# Patient Record
Sex: Male | Born: 1975 | Race: White | Hispanic: No | Marital: Married | Smoking: Never smoker
Health system: Southern US, Community
[De-identification: ages and names within clinical notes are randomized; demographics above are authoritative.]

## PROBLEM LIST (undated history)

## (undated) DIAGNOSIS — T7840XA Allergy, unspecified, initial encounter: Secondary | ICD-10-CM

## (undated) DIAGNOSIS — E785 Hyperlipidemia, unspecified: Secondary | ICD-10-CM

## (undated) DIAGNOSIS — F419 Anxiety disorder, unspecified: Secondary | ICD-10-CM

## (undated) DIAGNOSIS — I10 Essential (primary) hypertension: Secondary | ICD-10-CM

## (undated) HISTORY — DX: Allergy, unspecified, initial encounter: T78.40XA

---

## 2004-08-08 ENCOUNTER — Ambulatory Visit: Payer: Self-pay | Admitting: Unknown Physician Specialty

## 2012-04-03 ENCOUNTER — Ambulatory Visit (INDEPENDENT_AMBULATORY_CARE_PROVIDER_SITE_OTHER): Payer: Managed Care, Other (non HMO) | Admitting: Family Medicine

## 2012-04-03 VITALS — BP 125/84 | HR 69 | Temp 98.1°F | Resp 16 | Ht 71.0 in | Wt 204.0 lb

## 2012-04-03 DIAGNOSIS — Z0000 Encounter for general adult medical examination without abnormal findings: Secondary | ICD-10-CM

## 2012-04-03 DIAGNOSIS — R7301 Impaired fasting glucose: Secondary | ICD-10-CM

## 2012-04-03 DIAGNOSIS — E785 Hyperlipidemia, unspecified: Secondary | ICD-10-CM

## 2012-04-03 LAB — LIPID PANEL: Cholesterol: 246 mg/dL — ABNORMAL HIGH (ref 0–200)

## 2012-04-03 LAB — TSH: TSH: 3.331 u[IU]/mL (ref 0.350–4.500)

## 2012-04-03 LAB — COMPREHENSIVE METABOLIC PANEL
ALT: 24 U/L (ref 0–53)
Alkaline Phosphatase: 43 U/L (ref 39–117)
CO2: 27 mEq/L (ref 19–32)
Creat: 0.81 mg/dL (ref 0.50–1.35)
Sodium: 137 mEq/L (ref 135–145)
Total Bilirubin: 0.6 mg/dL (ref 0.3–1.2)
Total Protein: 7.3 g/dL (ref 6.0–8.3)

## 2012-04-03 LAB — POCT CBC
Hemoglobin: 15 g/dL (ref 14.1–18.1)
Lymph, poc: 2.5 (ref 0.6–3.4)
MCH, POC: 29 pg (ref 27–31.2)
MCHC: 32.3 g/dL (ref 31.8–35.4)
MID (cbc): 0.5 (ref 0–0.9)
MPV: 9.1 fL (ref 0–99.8)
POC Granulocyte: 2.3 (ref 2–6.9)
POC MID %: 9.4 %M (ref 0–12)
Platelet Count, POC: 213 10*3/uL (ref 142–424)
WBC: 5.3 10*3/uL (ref 4.6–10.2)

## 2012-04-03 NOTE — Progress Notes (Signed)
Subjective:    Patient ID: Arthur Stevens, male    DOB: 1976/01/21, 36 y.o.   MRN: [id number]  HPI Arthur Stevens is a 36 y.o. male  Here for physical. Needs form completed. Last physical weight 211 in 2011. Usually under 200.  No regular exercise. Has 2 and 1/38 yo dtr at home.  2nd child expected in February, work is going well.  Fasting this morning.   Hyperlipidemia - last lipids TC 257, HDL 58, LDL 178 on 03/04/10.  Plan on recheck in 6 weeks. No known FH of MI/CAD. Non smoker.  Glucose 107 on 03/04/10 fasting physical.  Review of Systems  Skin:       Callous on L foot for about a month  All other systems reviewed and are negative.       Objective:   Physical Exam  Vitals reviewed. Constitutional: He is oriented to person, place, and time. He appears well-developed and well-nourished.  HENT:  Head: Normocephalic and atraumatic.  Right Ear: External ear normal.  Left Ear: External ear normal.  Mouth/Throat: Oropharynx is clear and moist.  Eyes: Conjunctivae normal and EOM are normal. Pupils are equal, round, and reactive to light.  Neck: Normal range of motion. Neck supple. No thyromegaly present.  Cardiovascular: Normal rate, regular rhythm, normal heart sounds and intact distal pulses.   Pulmonary/Chest: Effort normal and breath sounds normal. No respiratory distress. He has no wheezes.  Abdominal: Soft. He exhibits no distension. There is no tenderness. Hernia confirmed negative in the right inguinal area and confirmed negative in the left inguinal area.  Genitourinary:    Right testis shows no mass and no tenderness. Left testis shows no mass and no tenderness.  Musculoskeletal: Normal range of motion. He exhibits no edema and no tenderness.  Lymphadenopathy:    He has no cervical adenopathy.  Neurological: He is alert and oriented to person, place, and time. He has normal reflexes.  Skin: Skin is warm and dry.       L foot - lateral aspect, approx 1 cm callous with  few elevated ridges. No surrounding erythema.  Psychiatric: He has a normal mood and affect. His behavior is normal.       Assessment & Plan:  [name] is a 36 y.o. male . 1. Elevated fasting glucose  Comprehensive metabolic panel  2. Hyperlipidemia  Comprehensive metabolic panel, POCT CBC, Lipid panel  3. Routine general medical examination at a health care facility  Comprehensive metabolic panel, POCT CBC, Lipid panel, TSH    CPE - labs above. Declined sti testing. Anticipatory guidance. Had flu shot already this year.   Hyperlipidemia - recheck today. Consider meds if elevated as in past.  Work on diet and wt loss.   Prior elevated glucose - recheck today as fasting.   L foot callous vs plantars wart.  Trial of otc compound W, callous pad and recheck in next month for paring/tx if needed.   Patient Instructions  Your should receive a call or letter about your lab results within the next week to 10 days.   Keeping you healthy  Get these tests  Blood pressure- Have your blood pressure checked once a year by your healthcare provider.  Normal blood pressure is 120/80.  Weight- Have your body mass index (BMI) calculated to screen for obesity.  BMI is a measure of body fat based on height and weight. You can also calculate your own BMI at https://www.west-esparza.com/.  Cholesterol- Have your cholesterol checked regularly starting  at age 81, sooner may be necessary if you have diabetes, high blood pressure, if a family member developed heart diseases at an early age or if you smoke.   Chlamydia, HIV, and other sexual transmitted disease- Get screened each year until the age of 54 then within three months of each new sexual partner.  Diabetes- Have your blood sugar checked regularly if you have high blood pressure, high cholesterol, a family history of diabetes or if you are overweight.  Get these vaccines  Flu shot- Every fall.  Tetanus shot- Every 10 years.  Menactra- Single  dose; prevents meningitis.  Take these steps  Don't smoke- If you do smoke, ask your healthcare provider about quitting. For tips on how to quit, go to www.smokefree.gov or call 1-800-QUIT-NOW.  Be physically active- Exercise 5 days a week for at least 30 minutes.  If you are not already physically active start slow and gradually work up to 30 minutes of moderate physical activity.  Examples of moderate activity include walking briskly, mowing the yard, dancing, swimming bicycling, etc.  Eat a healthy diet- Eat a variety of healthy foods such as fruits, vegetables, low fat milk, low fat cheese, yogurt, lean meats, poultry, fish, beans, tofu, etc.  For more information on healthy eating, go to www.thenutritionsource.org  Drink alcohol in moderation- Limit alcohol intake two drinks or less a day.  Never drink and drive.  Dentist- Brush and floss teeth twice daily; visit your dentis twice a year.  Depression-Your emotional health is as important as your physical health.  If you're feeling down, losing interest in things you normally enjoy please talk with your healthcare provider.  Gun Safety- If you keep a gun in your home, keep it unloaded and with the safety lock on.  Bullets should be stored separately.  Helmet use- Always wear a helmet when riding a motorcycle, bicycle, rollerblading or skateboarding.  Safe sex- If you may be exposed to a sexually transmitted infection, use a condom  Seat belts- Seat bels can save your life; always wear one.  Smoke/Carbon Monoxide detectors- These detectors need to be installed on the appropriate level of your home.  Replace batteries at least once a year.  Skin Cancer- When out in the sun, cover up and use sunscreen SPF 15 or higher.  Violence- If anyone is threatening or hurting you, please tell your healthcare provider.

## 2012-04-03 NOTE — Patient Instructions (Signed)
Your should receive a call or letter about your lab results within the next week to 10 days.   Keeping you healthy  Get these tests  Blood pressure- Have your blood pressure checked once a year by your healthcare provider.  Normal blood pressure is 120/80.  Weight- Have your body mass index (BMI) calculated to screen for obesity.  BMI is a measure of body fat based on height and weight. You can also calculate your own BMI at https://www.west-esparza.com/.  Cholesterol- Have your cholesterol checked regularly starting at age 53, sooner may be necessary if you have diabetes, high blood pressure, if a family member developed heart diseases at an early age or if you smoke.   Chlamydia, HIV, and other sexual transmitted disease- Get screened each year until the age of 37 then within three months of each new sexual partner.  Diabetes- Have your blood sugar checked regularly if you have high blood pressure, high cholesterol, a family history of diabetes or if you are overweight.  Get these vaccines  Flu shot- Every fall.  Tetanus shot- Every 10 years.  Menactra- Single dose; prevents meningitis.  Take these steps  Don't smoke- If you do smoke, ask your healthcare provider about quitting. For tips on how to quit, go to www.smokefree.gov or call 1-800-QUIT-NOW.  Be physically active- Exercise 5 days a week for at least 30 minutes.  If you are not already physically active start slow and gradually work up to 30 minutes of moderate physical activity.  Examples of moderate activity include walking briskly, mowing the yard, dancing, swimming bicycling, etc.  Eat a healthy diet- Eat a variety of healthy foods such as fruits, vegetables, low fat milk, low fat cheese, yogurt, lean meats, poultry, fish, beans, tofu, etc.  For more information on healthy eating, go to www.thenutritionsource.org  Drink alcohol in moderation- Limit alcohol intake two drinks or less a day.  Never drink and drive.  Dentist-  Brush and floss teeth twice daily; visit your dentis twice a year.  Depression-Your emotional health is as important as your physical health.  If you're feeling down, losing interest in things you normally enjoy please talk with your healthcare provider.  Gun Safety- If you keep a gun in your home, keep it unloaded and with the safety lock on.  Bullets should be stored separately.  Helmet use- Always wear a helmet when riding a motorcycle, bicycle, rollerblading or skateboarding.  Safe sex- If you may be exposed to a sexually transmitted infection, use a condom  Seat belts- Seat bels can save your life; always wear one.  Smoke/Carbon Monoxide detectors- These detectors need to be installed on the appropriate level of your home.  Replace batteries at least once a year.  Skin Cancer- When out in the sun, cover up and use sunscreen SPF 15 or higher.  Violence- If anyone is threatening or hurting you, please tell your healthcare provider.

## 2012-04-03 NOTE — Progress Notes (Signed)
I have given patient his wellness form, will fill in his total cholesterol when labs return/ Arthur Stevens He states he is here for his physical, and has been well, medical history is negative except for elevated glucose level and elevated cholesterol at last physical in 2011. Patients only complaint physically is a small callus or plantar wart on his left foot.

## 2012-04-22 ENCOUNTER — Telehealth: Payer: Self-pay

## 2012-04-22 NOTE — Telephone Encounter (Signed)
Pt states that he was supposed to have of copy of recent labs and labs from 2011 sent to his home, pt has not yet received these results and would like to know if they were ever mailed. Best# [id number]

## 2012-04-24 NOTE — Telephone Encounter (Signed)
Were mailed.

## 2012-04-29 ENCOUNTER — Telehealth: Payer: Self-pay

## 2012-04-29 NOTE — Telephone Encounter (Signed)
Spoke with pt he has not received lab results yet, i mailed them again

## 2012-04-29 NOTE — Telephone Encounter (Signed)
Patient requested his labs be mailed at time of service.  He has not received these.

## 2012-05-21 ENCOUNTER — Other Ambulatory Visit: Payer: Self-pay

## 2013-02-09 ENCOUNTER — Other Ambulatory Visit: Payer: Self-pay

## 2015-04-18 ENCOUNTER — Encounter: Payer: Self-pay | Admitting: Primary Care

## 2015-04-18 ENCOUNTER — Other Ambulatory Visit: Payer: Self-pay | Admitting: Primary Care

## 2015-04-18 ENCOUNTER — Ambulatory Visit (INDEPENDENT_AMBULATORY_CARE_PROVIDER_SITE_OTHER): Payer: BLUE CROSS/BLUE SHIELD | Admitting: Primary Care

## 2015-04-18 VITALS — BP 122/82 | HR 75 | Temp 97.7°F | Ht 71.0 in | Wt 211.0 lb

## 2015-04-18 DIAGNOSIS — Z0000 Encounter for general adult medical examination without abnormal findings: Secondary | ICD-10-CM | POA: Diagnosis not present

## 2015-04-18 DIAGNOSIS — E785 Hyperlipidemia, unspecified: Secondary | ICD-10-CM

## 2015-04-18 LAB — LIPID PANEL
CHOLESTEROL: 250 mg/dL — AB (ref 0–200)
HDL: 63.6 mg/dL (ref >39.00)
LDL Cholesterol: 172 mg/dL — ABNORMAL HIGH (ref 0–99)
NonHDL: 186.88
TRIGLYCERIDES: 74 mg/dL (ref 0.0–149.0)
Total CHOL/HDL Ratio: 4
VLDL: 14.8 mg/dL (ref 0.0–40.0)

## 2015-04-18 LAB — COMPREHENSIVE METABOLIC PANEL
ALBUMIN: 4.6 g/dL (ref 3.5–5.2)
ALT: 41 U/L (ref 0–53)
AST: 22 U/L (ref 0–37)
Alkaline Phosphatase: 46 U/L (ref 39–117)
BILIRUBIN TOTAL: 0.6 mg/dL (ref 0.2–1.2)
BUN: 11 mg/dL (ref 6–23)
CALCIUM: 9.9 mg/dL (ref 8.4–10.5)
CHLORIDE: 104 meq/L (ref 96–112)
CO2: 29 meq/L (ref 19–32)
CREATININE: 0.88 mg/dL (ref 0.40–1.50)
GFR: 101.42 mL/min (ref >60.00)
Glucose, Bld: 111 mg/dL — ABNORMAL HIGH (ref 70–99)
Potassium: 4.3 mEq/L (ref 3.5–5.1)
Sodium: 139 mEq/L (ref 135–145)
Total Protein: 7.3 g/dL (ref 6.0–8.3)

## 2015-04-18 LAB — HEMOGLOBIN A1C: HEMOGLOBIN A1C: 5.6 % (ref 4.6–6.5)

## 2015-04-18 MED ORDER — ATORVASTATIN CALCIUM 10 MG PO TABS: 10.0000 mg | ORAL_TABLET | Freq: Every evening | ORAL | Status: DC

## 2015-04-18 NOTE — Patient Instructions (Signed)
Complete lab work prior to leaving today. I will notify you of your results once received.   You must work to [employer].  Reduce consumption of: Fried foods, fatty foods, fast foods. Increase consumption of: Fruits, vegetables, whole grains, lean protein (baked/grilled).  Start exercising. You should be getting 1 hour of moderate intensity exercise 5 days weekly.  Acid Reflux: Try taking Zantac or Pepcid daily for 1 week. If symptoms do not subside, then continuing taking for an additional week. Take a look at the trigger foods for acid reflux.  Follow up in 1 year for repeat physical or sooner if needed.  It was a pleasure to meet you today! Please don't hesitate to call me with any questions. Welcome to [employer]!   High Cholesterol High cholesterol refers to having a high level of cholesterol in your blood. Cholesterol is a white, waxy, fat-like protein that your body needs in small amounts. Your liver makes all the cholesterol you need. Excess cholesterol comes from the food you eat. Cholesterol travels in your bloodstream through your blood vessels. If you have high cholesterol, deposits (plaque) may build up on the walls of your blood vessels. This makes the arteries narrower and stiffer. Plaque increases your risk of heart attack and stroke. Work with your health care provider to keep your cholesterol levels in a healthy range. RISK FACTORS Several things can make you more likely to have high cholesterol. These include:   Eating foods high in animal fat (saturated fat) or cholesterol.  Being overweight.  Not getting enough exercise.  Having a family history of high cholesterol. SIGNS AND SYMPTOMS High cholesterol does not cause symptoms. DIAGNOSIS  Your health care provider can do a blood test to check whether you have high cholesterol. If you are older than 20, your health care provider may check your cholesterol every 4-6 years. You may be checked more often if you  already have high cholesterol or other risk factors for heart disease. The blood test for cholesterol measures the following:  Bad cholesterol (LDL cholesterol). This is the type of cholesterol that causes heart disease. This number should be less than 100.  Good cholesterol (HDL cholesterol). This type helps protect against heart disease. A healthy level of HDL cholesterol is 60 or higher.  Total cholesterol. This is the combined number of LDL cholesterol and HDL cholesterol. A healthy number is less than 200. TREATMENT  High cholesterol can be treated with diet changes, lifestyle changes, and medicine.   Diet changes may include eating more whole grains, fruits, vegetables, nuts, and fish. You may also have to cut back on red meat and foods with a lot of added sugar.  Lifestyle changes may include getting at least 40 minutes of aerobic exercise three times a week. Aerobic exercises include walking, biking, and swimming. Aerobic exercise along with a healthy diet can help you maintain a healthy weight. Lifestyle changes may also include quitting smoking.  If diet and lifestyle changes are not enough to lower your cholesterol, your health care provider may prescribe a statin medicine. This medicine has been shown to lower cholesterol and also lower the risk of heart disease. HOME CARE INSTRUCTIONS  Only take over-the-counter or prescription medicines as directed by your health care provider.   Follow a healthy diet as directed by your health care provider. For instance:   Eat chicken (without skin), fish, veal, shellfish, ground [place] breast, and round or loin cuts of red meat.  Do not eat fried  foods and fatty meats, such as hot dogs and salami.   Eat plenty of fruits, such as apples.   Eat plenty of vegetables, such as broccoli, potatoes, and carrots.   Eat beans, peas, and lentils.   Eat grains, such as barley, rice, couscous, and bulgur wheat.   Eat pasta without cream  sauces.   Use skim or nonfat milk and low-fat or nonfat yogurt and cheeses. Do not eat or drink whole milk, cream, ice cream, egg yolks, and hard cheeses.   Do not eat stick margarine or tub margarines that contain trans fats (also called partially hydrogenated oils).   Do not eat cakes, cookies, crackers, or other baked goods that contain trans fats.   Do not eat saturated tropical oils, such as coconut and palm oil.   Exercise as directed by your health care provider. Increase your activity level with activities such as gardening or walking.   Keep all follow-up appointments.  SEEK MEDICAL CARE IF:  You are struggling to maintain a healthy diet or weight.  You need help starting an exercise program.  You need help to stop smoking. SEEK IMMEDIATE MEDICAL CARE IF:  You have chest pain.  You have trouble breathing.   This information is not intended to replace advice given to you by your health care provider. Make sure you discuss any questions you have with your health care provider.   Document Released: 03/23/2005 Document Revised: 04/13/2014 Document Reviewed: 01/13/2013 Elsevier Interactive Patient Education 2016 [place] for Gastroesophageal Reflux Disease, Adult When you have gastroesophageal reflux disease (GERD), the foods you eat and your eating habits are very important. Choosing the right foods can help ease the discomfort of GERD. WHAT GENERAL GUIDELINES DO I NEED TO FOLLOW?  Choose fruits, vegetables, whole grains, low-fat dairy products, and low-fat meat, fish, and poultry.  Limit fats such as oils, salad dressings, butter, nuts, and avocado.  Keep a food diary to identify foods that cause symptoms.  Avoid foods that cause reflux. These may be different for different people.  Eat frequent small meals instead of three large meals each day.  Eat your meals slowly, in a relaxed setting.  Limit fried foods.  Cook foods using  methods other than frying.  Avoid drinking alcohol.  Avoid drinking large amounts of liquids with your meals.  Avoid bending over or lying down until 2-3 hours after eating. WHAT FOODS ARE NOT RECOMMENDED? The following are some foods and drinks that may worsen your symptoms: Vegetables Tomatoes. Tomato juice. Tomato and spaghetti sauce. Chili peppers. Onion and garlic. Horseradish. Fruits Oranges, grapefruit, and lemon (fruit and juice). Meats High-fat meats, fish, and poultry. This includes hot dogs, ribs, ham, sausage, salami, and bacon. Dairy Whole milk and chocolate milk. Sour cream. Cream. Butter. Ice cream. Cream cheese.  Beverages Coffee and tea, with or without caffeine. Carbonated beverages or energy drinks. Condiments Hot sauce. Barbecue sauce.  Sweets/Desserts Chocolate and cocoa. Donuts. Peppermint and spearmint. Fats and Oils High-fat foods, including [place] fries and potato chips. Other Vinegar. Strong spices, such as black pepper, white pepper, red pepper, cayenne, curry powder, cloves, ginger, and chili powder. The items listed above may not be a complete list of foods and beverages to avoid. Contact your dietitian for more information.   This information is not intended to replace advice given to you by your health care provider. Make sure you discuss any questions you have with your health care provider.   Document Released:  03/23/2005 Document Revised: 04/13/2014 Document Reviewed: 01/25/2013 Elsevier Interactive Patient Education [employer].

## 2015-04-18 NOTE — Progress Notes (Signed)
Subjective:    Patient ID: Arthur Stevens, male    DOB: 18-Sep-1975, 40 y.o.   MRN: [id number]  HPI  Mr. Arthur Stevens is a 40 year old male who presents today to establish care and for complete physical.  Will review old records.  1) Hyperlipidemia: Long standing history of. Never managed on medication. Currently managed on Fish Oil.  TC in 2013 of 246, LDL of 164. TC in 2016 300 per work labs. No recent PCP.  2) Seasonal Allergies: Longstanding history of. Currently takes Zyrtec daily. Most bothersome during seasonal changes.   Immunizations: -Tetanus: Completed in March 2015 -Influenza: Completed in Fall 2016   Diet: Endorses a poor diet. Breakfast: Cereal bar Lunch: Fast food (will try to substitute with salads, fruit, chili) Dinner: Home cooked meals (protein, starches, vegetables), take out 2-3 times per week. Snacks: None Desserts: Occasionally Beverages: Diet coke, water, coffee, milk  Exercise: He does not currently exercise. Eye exam: Completed in December 2016 Dental exam: Completes annually.    Review of Systems  Constitutional: Negative for unexpected weight change.  HENT: Negative for rhinorrhea.   Respiratory: Negative for cough and shortness of breath.   Cardiovascular: Negative for chest pain.  Gastrointestinal: Negative for diarrhea and constipation.  Genitourinary: Negative for difficulty urinating.  Musculoskeletal: Negative for myalgias and arthralgias.  Skin: Negative for rash.  Allergic/Immunologic: Positive for environmental allergies.  Neurological: Negative for dizziness, numbness and headaches.  Psychiatric/Behavioral:       Denies concerns for anxiety or depression       Past Medical History  Diagnosis Date  . Allergy     Social History   Social History  . Marital Status: Married    Spouse Name: N/A  . Number of Children: N/A  . Years of Education: N/A   Occupational History  . Not on file.   Social History Main Topics  . Smoking  status: Never Smoker   . Smokeless tobacco: Not on file  . Alcohol Use: Yes  . Drug Use: No  . Sexual Activity: Yes    Birth Control/ Protection: None   Other Topics Concern  . Not on file   Social History Narrative   Married.   2 children.   Works as a Financial risk analyst.   Enjoys spending time with children, playing golf    History reviewed. No pertinent past surgical history.  Family History  Problem Relation Age of Onset  . Hyperlipidemia Mother   . Hypertension Mother   . Cancer Sister     No Known Allergies  Current Outpatient Prescriptions on File Prior to Visit  Medication Sig Dispense Refill  . Ascorbic Acid (VITAMIN C) 100 MG tablet Take 100 mg by mouth daily.    . diphenhydramine-acetaminophen (TYLENOL PM) 25-500 MG TABS Take 1 tablet by mouth at bedtime as needed.    . Multiple Vitamin (MULTIVITAMIN) tablet Take 1 tablet by mouth daily.    Marland Kitchen zinc gluconate 50 MG tablet Take 50 mg by mouth daily.     No current facility-administered medications on file prior to visit.    BP 122/82 mmHg  Pulse 75  Temp(Src) 97.7 F (36.5 C) (Oral)  Ht 5\' 11"  (1.803 m)  Wt 211 lb (95.709 kg)  BMI 29.44 kg/m2  SpO2 98%     Objective:   Physical Exam  Constitutional: He is oriented to person, place, and time. He appears well-nourished.  HENT:  Right Ear: Tympanic membrane and ear canal normal.  Left Ear:  Tympanic membrane and ear canal normal.  Nose: Nose normal.  Mouth/Throat: Oropharynx is clear and moist.  Eyes: Conjunctivae and EOM are normal.  Neck: Neck supple.  Cardiovascular: Normal rate and regular rhythm.   Pulmonary/Chest: Effort normal and breath sounds normal.  Abdominal: Soft. Bowel sounds are normal. There is no tenderness.  Musculoskeletal: Normal range of motion.  Neurological: He is alert and oriented to person, place, and time. He has normal reflexes. No cranial nerve deficit.  Skin: Skin is warm and dry.  Psychiatric: He has a normal mood and  affect.          Assessment & Plan:

## 2015-04-18 NOTE — Assessment & Plan Note (Signed)
tdap and flu UTD. Discussed the importance of a healthy diet and regular exercise in order for weight loss and to reduce risk of other medical diseases. Exam unremarkable. Labs pending. Offered advise for improvements in diet. Follow up in 1 year for repeat physical.

## 2015-04-18 NOTE — Progress Notes (Signed)
Pre visit review using our clinic review tool, if applicable. No additional management support is needed unless otherwise documented below in the visit note. 

## 2015-04-18 NOTE — Assessment & Plan Note (Addendum)
History of in the past. No prior or current treatment except for Fish Oil. Recent TC of 300 in 05/2014 per work labs. Will repeat lipids today, anticipate the need for medication. Discussed common side effects and importance of healthy diet and regular exercise. Will obtain LFT's prior to initiation of statin. If elevated, repeat lipids in 3 months.

## 2015-05-21 ENCOUNTER — Ambulatory Visit (INDEPENDENT_AMBULATORY_CARE_PROVIDER_SITE_OTHER): Payer: BLUE CROSS/BLUE SHIELD | Admitting: Primary Care

## 2015-05-21 VITALS — BP 142/84 | HR 91 | Temp 98.4°F | Ht 71.0 in | Wt 210.4 lb

## 2015-05-21 DIAGNOSIS — E785 Hyperlipidemia, unspecified: Secondary | ICD-10-CM

## 2015-05-21 NOTE — Patient Instructions (Signed)
Your symptoms are related to a viral infection which will pass on its own.  Continue taking Mucinex for cough and congestion. Continue tylenol or ibuprofen for body aches.  Please call me if you develop fevers of 101, fatigue, productive cough with green mucous.  It was a pleasure to see you today!  Upper Respiratory Infection, Adult Most upper respiratory infections (URIs) are a viral infection of the air passages leading to the lungs. A URI affects the nose, throat, and upper air passages. The most common type of URI is nasopharyngitis and is typically referred to as "the common cold." URIs run their course and usually go away on their own. Most of the time, a URI does not require medical attention, but sometimes a bacterial infection in the upper airways can follow a viral infection. This is called a secondary infection. Sinus and middle ear infections are common types of secondary upper respiratory infections. Bacterial pneumonia can also complicate a URI. A URI can worsen asthma and chronic obstructive pulmonary disease (COPD). Sometimes, these complications can require emergency medical care and may be life threatening.  CAUSES Almost all URIs are caused by viruses. A virus is a type of germ and can spread from one person to another.  RISKS FACTORS You may be at risk for a URI if:   You smoke.   You have chronic heart or lung disease.  You have a weakened defense (immune) system.   You are very young or very old.   You have nasal allergies or asthma.  You work in crowded or poorly ventilated areas.  You work in health care facilities or schools. SIGNS AND SYMPTOMS  Symptoms typically develop 2-3 days after you come in contact with a cold virus. Most viral URIs last 7-10 days. However, viral URIs from the influenza virus (flu virus) can last 14-18 days and are typically more severe. Symptoms may include:   Runny or stuffy (congested) nose.   Sneezing.   Cough.   Sore  throat.   Headache.   Fatigue.   Fever.   Loss of appetite.   Pain in your forehead, behind your eyes, and over your cheekbones (sinus pain).  Muscle aches.  DIAGNOSIS  Your health care provider may diagnose a URI by:  Physical exam.  Tests to check that your symptoms are not due to another condition such as:  Strep throat.  Sinusitis.  Pneumonia.  Asthma. TREATMENT  A URI goes away on its own with time. It cannot be cured with medicines, but medicines may be prescribed or recommended to relieve symptoms. Medicines may help:  Reduce your fever.  Reduce your cough.  Relieve nasal congestion. HOME CARE INSTRUCTIONS   Take medicines only as directed by your health care provider.   Gargle warm saltwater or take cough drops to comfort your throat as directed by your health care provider.  Use a warm mist humidifier or inhale steam from a shower to increase air moisture. This may make it easier to breathe.  Drink enough fluid to keep your urine clear or pale yellow.   Eat soups and other clear broths and maintain good nutrition.   Rest as needed.   Return to work when your temperature has returned to normal or as your health care provider advises. You may need to stay home longer to avoid infecting others. You can also use a face mask and careful hand washing to prevent spread of the virus.  Increase the usage of your inhaler if you  have asthma.   Do not use any tobacco products, including cigarettes, chewing tobacco, or electronic cigarettes. If you need help quitting, ask your health care provider. PREVENTION  The best way to protect yourself from getting a cold is to practice good hygiene.   Avoid oral or hand contact with people with cold symptoms.   Wash your hands often if contact occurs.  There is no clear evidence that vitamin C, vitamin E, echinacea, or exercise reduces the chance of developing a cold. However, it is always recommended to  get plenty of rest, exercise, and practice good nutrition.  SEEK MEDICAL CARE IF:   You are getting worse rather than better.   Your symptoms are not controlled by medicine.   You have chills.  You have worsening shortness of breath.  You have brown or red mucus.  You have yellow or brown nasal discharge.  You have pain in your face, especially when you bend forward.  You have a fever.  You have swollen neck glands.  You have pain while swallowing.  You have white areas in the back of your throat. SEEK IMMEDIATE MEDICAL CARE IF:   You have severe or persistent:  Headache.  Ear pain.  Sinus pain.  Chest pain.  You have chronic lung disease and any of the following:  Wheezing.  Prolonged cough.  Coughing up blood.  A change in your usual mucus.  You have a stiff neck.  You have changes in your:  Vision.  Hearing.  Thinking.  Mood. MAKE SURE YOU:   Understand these instructions.  Will watch your condition.  Will get help right away if you are not doing well or get worse.   This information is not intended to replace advice given to you by your health care provider. Make sure you discuss any questions you have with your health care provider.   Document Released: 09/16/2000 Document Revised: 08/07/2014 Document Reviewed: 06/28/2013 Elsevier Interactive Patient Education [employer].

## 2015-05-21 NOTE — Progress Notes (Signed)
Pre visit review using our clinic review tool, if applicable. No additional management support is needed unless otherwise documented below in the visit note. 

## 2015-05-21 NOTE — Progress Notes (Signed)
Subjective:    Patient ID: Arthur Stevens, male    DOB: 1975/05/26, 40 y.o.   MRN: [id number]  HPI  Mr. Ruyle is a 40 year old male who presents today with a chief complaint of nasal congestion. She also reports chills, headache, body aches, fatigue, sinus pressure. His symptoms have been present since Thursday last week. Overall he's feeling improved as most of his symptoms have dissipated. He's been taking Mucinex, ibuprofen/tylenol, and Day/Night Cough medication. He will be leaving for an out of town work trip soon and would like to ensure he will not be ill. Denies sick contacts.  Review of Systems  Constitutional: Positive for chills. Negative for fever.  HENT: Positive for congestion. Negative for sinus pressure.   Respiratory: Negative for cough.   Neurological: Positive for headaches.       Past Medical History  Diagnosis Date  . Allergy     Social History   Social History  . Marital Status: Married    Spouse Name: N/A  . Number of Children: N/A  . Years of Education: N/A   Occupational History  . Not on file.   Social History Main Topics  . Smoking status: Never Smoker   . Smokeless tobacco: Not on file  . Alcohol Use: Yes  . Drug Use: No  . Sexual Activity: Yes    Birth Control/ Protection: None   Other Topics Concern  . Not on file   Social History Narrative   Married.   2 children.   Works as a Financial risk analyst.   Enjoys spending time with children, playing golf    No past surgical history on file.  Family History  Problem Relation Age of Onset  . Hyperlipidemia Mother   . Hypertension Mother   . Cancer Sister     No Known Allergies  Current Outpatient Prescriptions on File Prior to Visit  Medication Sig Dispense Refill  . Ascorbic Acid (VITAMIN C) 100 MG tablet Take 100 mg by mouth daily.    . cetirizine (ZYRTEC) 10 MG tablet Take 10 mg by mouth daily.    . diphenhydramine-acetaminophen (TYLENOL PM) 25-500 MG TABS Take 1 tablet by mouth  at bedtime as needed.    Marland Kitchen ibuprofen (ADVIL,MOTRIN) 200 MG tablet Take 200 mg by mouth every 6 (six) hours as needed.    . Multiple Vitamin (MULTIVITAMIN) tablet Take 1 tablet by mouth daily.    . Omega-3 Fatty Acids (FISH OIL) 1200 MG CAPS Take 2 capsules by mouth daily.    Marland Kitchen zinc gluconate 50 MG tablet Take 50 mg by mouth daily.    Marland Kitchen atorvastatin (LIPITOR) 10 MG tablet Take 1 tablet (10 mg total) by mouth every evening. (Patient not taking: Reported on 05/21/2015) 90 tablet 1   No current facility-administered medications on file prior to visit.    BP 142/84 mmHg  Pulse 91  Temp(Src) 98.4 F (36.9 C) (Oral)  Ht 5\' 11"  (1.803 m)  Wt 210 lb 6.4 oz (95.437 kg)  BMI 29.36 kg/m2  SpO2 98%    Objective:   Physical Exam  Constitutional: He appears well-nourished.  HENT:  Right Ear: Tympanic membrane and ear canal normal.  Left Ear: Tympanic membrane and ear canal normal.  Nose: Right sinus exhibits no maxillary sinus tenderness and no frontal sinus tenderness. Left sinus exhibits no maxillary sinus tenderness and no frontal sinus tenderness.  Mouth/Throat: Oropharynx is clear and moist.  Eyes: Conjunctivae are normal.  Neck: Neck supple.  Cardiovascular:  Normal rate and regular rhythm.   Pulmonary/Chest: Effort normal and breath sounds normal. He has no wheezes. He has no rales.  Lymphadenopathy:    He has no cervical adenopathy.  Skin: Skin is warm and dry.          Assessment & Plan:  Viral URI:  Body aches, chills, sinus pressure, fatigue since last Thursday. Overall feeling improved and nearly 100% better.  Exam with clear lungs and unremarkable HENT exam. No cough noted on exam. Suspect viral URI that is nearly over.  Will treat with supportive measures. Continue mucinex, tylenol, Nyquil PRN. Return precautions provided.

## 2015-05-21 NOTE — Assessment & Plan Note (Signed)
Endorses today that he's not started his Lipitor but will start this coming week. He's also not lost any weight.

## 2015-08-12 DIAGNOSIS — D229 Melanocytic nevi, unspecified: Secondary | ICD-10-CM | POA: Diagnosis not present

## 2015-08-12 DIAGNOSIS — L309 Dermatitis, unspecified: Secondary | ICD-10-CM | POA: Diagnosis not present

## 2015-08-12 DIAGNOSIS — D1800 Hemangioma unspecified site: Secondary | ICD-10-CM | POA: Diagnosis not present

## 2015-08-12 DIAGNOSIS — D3610 Benign neoplasm of peripheral nerves and autonomic nervous system, unspecified: Secondary | ICD-10-CM | POA: Diagnosis not present

## 2016-01-14 ENCOUNTER — Other Ambulatory Visit: Payer: Self-pay | Admitting: Primary Care

## 2016-01-14 DIAGNOSIS — E785 Hyperlipidemia, unspecified: Secondary | ICD-10-CM

## 2016-01-19 DIAGNOSIS — Z23 Encounter for immunization: Secondary | ICD-10-CM | POA: Diagnosis not present

## 2016-04-13 ENCOUNTER — Other Ambulatory Visit: Payer: Self-pay | Admitting: Primary Care

## 2016-04-13 DIAGNOSIS — E785 Hyperlipidemia, unspecified: Secondary | ICD-10-CM

## 2016-05-26 ENCOUNTER — Ambulatory Visit (INDEPENDENT_AMBULATORY_CARE_PROVIDER_SITE_OTHER): Payer: BLUE CROSS/BLUE SHIELD | Admitting: Primary Care

## 2016-05-26 ENCOUNTER — Encounter: Payer: Self-pay | Admitting: Primary Care

## 2016-05-26 VITALS — BP 120/82 | HR 77 | Temp 97.9°F | Ht 71.0 in | Wt 214.0 lb

## 2016-05-26 DIAGNOSIS — E785 Hyperlipidemia, unspecified: Secondary | ICD-10-CM | POA: Diagnosis not present

## 2016-05-26 DIAGNOSIS — Z0000 Encounter for general adult medical examination without abnormal findings: Secondary | ICD-10-CM | POA: Diagnosis not present

## 2016-05-26 LAB — COMPREHENSIVE METABOLIC PANEL
ALBUMIN: 4.4 g/dL (ref 3.5–5.2)
ALT: 44 U/L (ref 0–53)
AST: 25 U/L (ref 0–37)
Alkaline Phosphatase: 49 U/L (ref 39–117)
BUN: 15 mg/dL (ref 6–23)
CHLORIDE: 103 meq/L (ref 96–112)
CO2: 29 meq/L (ref 19–32)
CREATININE: 0.84 mg/dL (ref 0.40–1.50)
Calcium: 9.3 mg/dL (ref 8.4–10.5)
GFR: 106.43 mL/min (ref >60.00)
GLUCOSE: 107 mg/dL — AB (ref 70–99)
POTASSIUM: 4.5 meq/L (ref 3.5–5.1)
SODIUM: 137 meq/L (ref 135–145)
Total Bilirubin: 0.4 mg/dL (ref 0.2–1.2)
Total Protein: 7.5 g/dL (ref 6.0–8.3)

## 2016-05-26 LAB — LIPID PANEL
CHOL/HDL RATIO: 4
Cholesterol: 210 mg/dL — ABNORMAL HIGH (ref 0–200)
HDL: 57.9 mg/dL (ref >39.00)
LDL CALC: 133 mg/dL — AB (ref 0–99)
NONHDL: 152.29
Triglycerides: 98 mg/dL (ref 0.0–149.0)
VLDL: 19.6 mg/dL (ref 0.0–40.0)

## 2016-05-26 LAB — HEMOGLOBIN A1C: HEMOGLOBIN A1C: 5.8 % (ref 4.6–6.5)

## 2016-05-26 NOTE — Assessment & Plan Note (Signed)
Managed on Lipitor 10 mg. Lipids and CMP pending today. Discussed the importance of a healthy diet and regular exercise in order for weight loss, and to reduce the risk of other medical diseases.

## 2016-05-26 NOTE — Patient Instructions (Addendum)
Complete lab work prior to leaving today. I will notify you of your results once received.   It's important to improve your diet by reducing consumption of fast food, fried food, processed snack foods, sugary drinks. Increase consumption of fresh vegetables and fruits, whole grains, water.  Ensure you are drinking 64 ounces of water daily.  Start exercising. You should be getting 150 minutes of moderate intensity exercise weekly.  Follow up in 1 year for your annual exam or sooner if needed.  It was a pleasure to see you today!

## 2016-05-26 NOTE — Assessment & Plan Note (Signed)
Immunizations UTD. Discussed the importance of a healthy diet and regular exercise in order for weight loss, and to reduce the risk of other medical diseases. Exam unremarkable. Labs pending. Follow up in 1 year for annual exam.

## 2016-05-26 NOTE — Progress Notes (Signed)
Subjective:    Patient ID: Arthur Stevens, male    DOB: 1975/08/12, 41 y.o.   MRN: [id number]  HPI  Mr. Arthur Stevens is a 41 year old male who presents today for complete physical.  Immunizations: -Tetanus: Completed in 2015 -Influenza: Completed in Fall 2017   Diet: He endorses a fair diet. Breakfast: Fruit, granola bar Lunch: Soup, salad, sandwich Dinner: Take out, little home cooked meals Snacks: Sweets Desserts: Daily Beverages: Coffee, diet coke, water, beer  Exercise: He does not currently exercise Eye exam: Scheduled for tomorrow Dental exam: Completes semi-annual   Review of Systems  Constitutional: Negative for unexpected weight change.  HENT: Negative for rhinorrhea.        Snoring, occasionally. Denies frequent waking during the night.  Respiratory: Negative for cough and shortness of breath.   Cardiovascular: Negative for chest pain.  Gastrointestinal: Negative for constipation and diarrhea.  Genitourinary: Negative for difficulty urinating.  Musculoskeletal: Negative for arthralgias and myalgias.  Skin: Negative for rash.  Allergic/Immunologic: Negative for environmental allergies.  Neurological: Negative for dizziness, numbness and headaches.  Psychiatric/Behavioral:       Denies concerns for anxiety or depression       Past Medical History:  Diagnosis Date  . Allergy      Social History   Social History  . Marital status: Married    Spouse name: N/A  . Number of children: N/A  . Years of education: N/A   Occupational History  . Not on file.   Social History Main Topics  . Smoking status: Never Smoker  . Smokeless tobacco: Never Used  . Alcohol use Yes  . Drug use: No  . Sexual activity: Yes    Birth control/ protection: None   Other Topics Concern  . Not on file   Social History Narrative   Married.   2 children.   Works as a Financial risk analyst.   Enjoys spending time with children, playing golf    No past surgical history on  file.  Family History  Problem Relation Age of Onset  . Hyperlipidemia Mother   . Hypertension Mother   . Cancer Sister     No Known Allergies  Current Outpatient Prescriptions on File Prior to Visit  Medication Sig Dispense Refill  . Ascorbic Acid (VITAMIN C) 100 MG tablet Take 100 mg by mouth daily.    Marland Kitchen atorvastatin (LIPITOR) 10 MG tablet TAKE 1 TABLET (10 MG TOTAL) BY MOUTH EVERY EVENING. 90 tablet 0  . cetirizine (ZYRTEC) 10 MG tablet Take 10 mg by mouth daily.    . diphenhydramine-acetaminophen (TYLENOL PM) 25-500 MG TABS Take 1 tablet by mouth at bedtime as needed.    Marland Kitchen ibuprofen (ADVIL,MOTRIN) 200 MG tablet Take 200 mg by mouth every 6 (six) hours as needed.    . Multiple Vitamin (MULTIVITAMIN) tablet Take 1 tablet by mouth daily.    . Omega-3 Fatty Acids (FISH OIL) 1200 MG CAPS Take 2 capsules by mouth daily.    Marland Kitchen zinc gluconate 50 MG tablet Take 50 mg by mouth daily.     No current facility-administered medications on file prior to visit.     BP 120/82   Pulse 77   Temp 97.9 F (36.6 C) (Oral)   Ht 5\' 11"  (1.803 m)   Wt 214 lb (97.1 kg)   SpO2 97%   BMI 29.85 kg/m    Objective:   Physical Exam  Constitutional: He is oriented to person, place, and time. He appears  well-nourished.  HENT:  Right Ear: Tympanic membrane and ear canal normal.  Left Ear: Tympanic membrane and ear canal normal.  Nose: Nose normal. Right sinus exhibits no maxillary sinus tenderness and no frontal sinus tenderness. Left sinus exhibits no maxillary sinus tenderness and no frontal sinus tenderness.  Mouth/Throat: Oropharynx is clear and moist.  Eyes: Conjunctivae and EOM are normal. Pupils are equal, round, and reactive to light.  Neck: Neck supple. Carotid bruit is not present. No thyromegaly present.  Cardiovascular: Normal rate, regular rhythm and normal heart sounds.   Pulmonary/Chest: Effort normal and breath sounds normal. He has no wheezes. He has no rales.  Abdominal: Soft. Bowel  sounds are normal. There is no tenderness.  Musculoskeletal: Normal range of motion.  Neurological: He is alert and oriented to person, place, and time. He has normal reflexes. No cranial nerve deficit.  Skin: Skin is warm and dry.  Psychiatric: He has a normal mood and affect.          Assessment & Plan:

## 2016-05-26 NOTE — Progress Notes (Signed)
Pre visit review using our clinic review tool, if applicable. No additional management support is needed unless otherwise documented below in the visit note. 

## 2016-05-28 ENCOUNTER — Encounter: Payer: Self-pay | Admitting: Primary Care

## 2016-06-02 ENCOUNTER — Telehealth: Payer: Self-pay | Admitting: Primary Care

## 2016-06-02 ENCOUNTER — Other Ambulatory Visit: Payer: Self-pay | Admitting: Primary Care

## 2016-06-02 DIAGNOSIS — E785 Hyperlipidemia, unspecified: Secondary | ICD-10-CM

## 2016-06-02 MED ORDER — ATORVASTATIN CALCIUM 20 MG PO TABS: 20.0000 mg | ORAL_TABLET | Freq: Every evening | ORAL | 1 refills | Status: DC

## 2016-06-02 NOTE — Telephone Encounter (Signed)
Spoken and notified patient of Kate's comments. Patient verbalized understanding. 

## 2016-06-02 NOTE — Telephone Encounter (Signed)
Pt returned call, please call [phone] Thanks

## 2016-08-11 DIAGNOSIS — D1801 Hemangioma of skin and subcutaneous tissue: Secondary | ICD-10-CM | POA: Diagnosis not present

## 2016-08-11 DIAGNOSIS — L814 Other melanin hyperpigmentation: Secondary | ICD-10-CM | POA: Diagnosis not present

## 2016-08-11 DIAGNOSIS — D225 Melanocytic nevi of trunk: Secondary | ICD-10-CM | POA: Diagnosis not present

## 2016-08-11 DIAGNOSIS — D2230 Melanocytic nevi of unspecified part of face: Secondary | ICD-10-CM | POA: Diagnosis not present

## 2016-12-02 ENCOUNTER — Other Ambulatory Visit: Payer: Self-pay | Admitting: Primary Care

## 2016-12-02 DIAGNOSIS — E785 Hyperlipidemia, unspecified: Secondary | ICD-10-CM

## 2017-05-17 ENCOUNTER — Telehealth: Payer: Self-pay | Admitting: Primary Care

## 2017-05-17 NOTE — Telephone Encounter (Signed)
See below message  Copied from [place]. Topic: Inquiry >> May 17, 2017 12:21 PM Pricilla Handler wrote: Reason for CRM: Patient called wanting to know if Allie Bossier would consider working him in for a Physical on February 18th, 2019. Patient works in [employer], and only has 05/24/2017 off. Patient states that he needs his physical completed by 06/03/2017 for his job. Patient wants a return call today [phone].       Thank You!!!

## 2017-05-18 NOTE — Telephone Encounter (Addendum)
Message left for patient to return my call.  Need to schedule CPE.  It can be done by combing slots 9:15 and 9:30 on 05/24/2017

## 2017-05-18 NOTE — Telephone Encounter (Signed)
Tameka spoke with pt but could not schedule 30' CPX on 05/24/17 at 9:15. I was able to schedule CPX and Tameka was notifying pt who was still on phone.

## 2017-05-24 ENCOUNTER — Ambulatory Visit (INDEPENDENT_AMBULATORY_CARE_PROVIDER_SITE_OTHER): Payer: BLUE CROSS/BLUE SHIELD | Admitting: Primary Care

## 2017-05-24 ENCOUNTER — Encounter: Payer: Self-pay | Admitting: Primary Care

## 2017-05-24 VITALS — BP 130/80 | HR 94 | Temp 97.9°F | Ht 71.0 in | Wt 218.5 lb

## 2017-05-24 DIAGNOSIS — M25512 Pain in left shoulder: Secondary | ICD-10-CM | POA: Diagnosis not present

## 2017-05-24 DIAGNOSIS — R7303 Prediabetes: Secondary | ICD-10-CM | POA: Diagnosis not present

## 2017-05-24 DIAGNOSIS — E785 Hyperlipidemia, unspecified: Secondary | ICD-10-CM | POA: Diagnosis not present

## 2017-05-24 DIAGNOSIS — Z0000 Encounter for general adult medical examination without abnormal findings: Secondary | ICD-10-CM | POA: Diagnosis not present

## 2017-05-24 LAB — COMPREHENSIVE METABOLIC PANEL
ALBUMIN: 4.7 g/dL (ref 3.5–5.2)
ALT: 25 U/L (ref 0–53)
AST: 18 U/L (ref 0–37)
Alkaline Phosphatase: 47 U/L (ref 39–117)
BILIRUBIN TOTAL: 0.6 mg/dL (ref 0.2–1.2)
BUN: 15 mg/dL (ref 6–23)
CALCIUM: 9.5 mg/dL (ref 8.4–10.5)
CO2: 28 mEq/L (ref 19–32)
CREATININE: 0.87 mg/dL (ref 0.40–1.50)
Chloride: 100 mEq/L (ref 96–112)
GFR: 101.73 mL/min (ref >60.00)
Glucose, Bld: 106 mg/dL — ABNORMAL HIGH (ref 70–99)
Potassium: 4.2 mEq/L (ref 3.5–5.1)
Sodium: 135 mEq/L (ref 135–145)
Total Protein: 7.8 g/dL (ref 6.0–8.3)

## 2017-05-24 LAB — LIPID PANEL
CHOLESTEROL: 189 mg/dL (ref 0–200)
HDL: 52.7 mg/dL (ref >39.00)
LDL CALC: 113 mg/dL — AB (ref 0–99)
NonHDL: 136.77
TRIGLYCERIDES: 118 mg/dL (ref 0.0–149.0)
Total CHOL/HDL Ratio: 4
VLDL: 23.6 mg/dL (ref 0.0–40.0)

## 2017-05-24 LAB — HEMOGLOBIN A1C: HEMOGLOBIN A1C: 5.8 % (ref 4.6–6.5)

## 2017-05-24 MED ORDER — NAPROXEN 500 MG PO TABS: 500.0000 mg | ORAL_TABLET | Freq: Two times a day (BID) | ORAL | 0 refills | Status: DC

## 2017-05-24 NOTE — Progress Notes (Signed)
Subjective:    Patient ID: Arthur Stevens, male    DOB: 07/10/1975, 42 y.o.   MRN: [id number]  HPI  Arthur Stevens is a 42 year old male who presents today for complete physical and a chief complaint of shoulder pain.  1) Shoulder Pain: Located to the left shoulder that has been present for the past 3 weeks. He has noticed decrease in ROM with pain with lateral abduction. He denies injury/trauma, numbness/tingling. He's taken Ibuprofen infrequently at times. He does pick up his daughter at times, not repetitively.   Immunizations: -Tetanus: Completed in 2015 -Influenza: Completed this season   Diet: He endorses a fair diet. Sometimes he'll do better than usual. Breakfast: Fruit, nutrigrain Lunch: Fast food (healthier choices) Dinner: Meat, vegetable, starch Snacks: Beef jerkey, chips, nuts Desserts: Chocolate, cake nightly. Small portions. Beverages: Diet soda, some water, coffee with splenda, occasional alcohol   Exercise: He does not currently exercise Eye exam: Completed in late 2018 Dental exam: Completes 1-2 times annually   Review of Systems  Constitutional: Negative for unexpected weight change.  HENT: Negative for rhinorrhea.   Respiratory: Negative for cough and shortness of breath.   Cardiovascular: Negative for chest pain.  Gastrointestinal: Negative for constipation and diarrhea.  Genitourinary: Negative for difficulty urinating.  Musculoskeletal: Negative for arthralgias and myalgias.       Left shoulder pain  Skin: Negative for rash.  Allergic/Immunologic: Negative for environmental allergies.  Neurological: Negative for dizziness, numbness and headaches.  Psychiatric/Behavioral:       Denies concerns for anxiety and depression       Past Medical History:  Diagnosis Date  . Allergy      Social History   Socioeconomic History  . Marital status: Married    Spouse name: Not on file  . Number of children: Not on file  . Years of education: Not on file  .  Highest education level: Not on file  Social Needs  . Financial resource strain: Not on file  . Food insecurity - worry: Not on file  . Food insecurity - inability: Not on file  . Transportation needs - medical: Not on file  . Transportation needs - non-medical: Not on file  Occupational History  . Not on file  Tobacco Use  . Smoking status: Never Smoker  . Smokeless tobacco: Never Used  Substance and Sexual Activity  . Alcohol use: Yes  . Drug use: No  . Sexual activity: Yes    Birth control/protection: None  Other Topics Concern  . Not on file  Social History Narrative   Married.   2 children.   Works as a Financial risk analyst.   Enjoys spending time with children, playing golf    No past surgical history on file.  Family History  Problem Relation Age of Onset  . Hyperlipidemia Mother   . Hypertension Mother   . Cancer Sister     No Known Allergies  Current Outpatient Medications on File Prior to Visit  Medication Sig Dispense Refill  . Ascorbic Acid (VITAMIN C) 100 MG tablet Take 100 mg by mouth daily.    Marland Kitchen atorvastatin (LIPITOR) 20 MG tablet TAKE 1 TABLET (20 MG TOTAL) BY MOUTH EVERY EVENING. 90 tablet 1  . cetirizine (ZYRTEC) 10 MG tablet Take 10 mg by mouth daily.    . diphenhydramine-acetaminophen (TYLENOL PM) 25-500 MG TABS Take 1 tablet by mouth at bedtime as needed.    Marland Kitchen ibuprofen (ADVIL,MOTRIN) 200 MG tablet Take 200 mg by  mouth every 6 (six) hours as needed.    . Multiple Vitamin (MULTIVITAMIN) tablet Take 1 tablet by mouth daily.    Marland Kitchen zinc gluconate 50 MG tablet Take 50 mg by mouth daily.     No current facility-administered medications on file prior to visit.     BP 130/80   Pulse 94   Temp 97.9 F (36.6 C) (Oral)   Ht 5\' 11"  (1.803 m)   Wt 218 lb 8 oz (99.1 kg)   SpO2 97%   BMI 30.47 kg/m    Objective:   Physical Exam  Constitutional: He is oriented to person, place, and time. He appears well-nourished.  HENT:  Right Ear: Tympanic membrane and  ear canal normal.  Left Ear: Tympanic membrane and ear canal normal.  Nose: Nose normal. Right sinus exhibits no maxillary sinus tenderness and no frontal sinus tenderness. Left sinus exhibits no maxillary sinus tenderness and no frontal sinus tenderness.  Mouth/Throat: Oropharynx is clear and moist.  Eyes: Conjunctivae and EOM are normal. Pupils are equal, round, and reactive to light.  Neck: Neck supple. Carotid bruit is not present. No thyromegaly present.  Cardiovascular: Normal rate, regular rhythm and normal heart sounds.  Pulmonary/Chest: Effort normal and breath sounds normal. He has no wheezes. He has no rales.  Abdominal: Soft. Bowel sounds are normal. There is no tenderness.  Musculoskeletal:       Left shoulder: He exhibits decreased range of motion and pain.  Mild decrease in ROM to lateral abduction. 5/5 strength to bilateral upper extremities.  Neurological: He is alert and oriented to person, place, and time. He has normal reflexes. No cranial nerve deficit.  Skin: Skin is warm and dry.  Psychiatric: He has a normal mood and affect.          Assessment & Plan:  Acute Shoulder Pain:  Located to left shoulder x 3 weeks, no injury/trauma. Mild decrease in ROM with lateral abduction.  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  Start with NSAID's daily x 7-10 days.  Stretching exercises provided for him to do at home.  Discussed ice/heat.  Consider sport med evaluation for steroid injection if no improvement in 2 weeks.  Pleas Koch, NP

## 2017-05-24 NOTE — Assessment & Plan Note (Signed)
A1C of 5.8 last year, will repeat today. Discussed the importance of a healthy diet and regular exercise in order for weight loss, and to reduce the risk of any potential medical problems.

## 2017-05-24 NOTE — Patient Instructions (Addendum)
Start naproxen 500 mg tablets. Take 1 tablet twice daily with food for 7-10 days.   Complete the stretching and range of motion exercises daily.  Please schedule an appointment with Dr. Lorelei Pont in 2 weeks if no improvement.  Start exercising. You should be getting 150 minutes of moderate intensity exercise weekly.  It's important to improve your diet by reducing consumption of fast food, fried food, processed snack foods, sugary drinks. Increase consumption of fresh vegetables and fruits, whole grains, water.  Ensure you are drinking 64 ounces of water daily.  Stop by the lab prior to leaving today. I will notify you of your results once received.   It was a pleasure to see you today!   Shoulder Exercises Ask your health care provider which exercises are safe for you. Do exercises exactly as told by your health care provider and adjust them as directed. It is normal to feel mild stretching, pulling, tightness, or discomfort as you do these exercises, but you should stop right away if you feel sudden pain or your pain gets worse.Do not begin these exercises until told by your health care provider. RANGE OF MOTION EXERCISES These exercises warm up your muscles and joints and improve the movement and flexibility of your shoulder. These exercises also help to relieve pain, numbness, and tingling. These exercises involve stretching your injured shoulder directly. Exercise A: Pendulum  1. Stand near a wall or a surface that you can hold onto for balance. 2. Bend at the waist and let your left / right arm hang straight down. Use your other arm to support you. Keep your back straight and do not lock your knees. 3. Relax your left / right arm and shoulder muscles, and move your hips and your trunk so your left / right arm swings freely. Your arm should swing because of the motion of your body, not because you are using your arm or shoulder muscles. 4. Keep moving your body so your arm swings in the  following directions, as told by your health care provider: ? Side to side. ? Forward and backward. ? In clockwise and counterclockwise circles. 5. Continue each motion for __________ seconds, or for as long as told by your health care provider. 6. Slowly return to the starting position. Repeat __________ times. Complete this exercise __________ times a day. Exercise B:Flexion, Standing  1. Stand and hold a broomstick, a cane, or a similar object. Place your hands a little more than shoulder-width apart on the object. Your left / right hand should be palm-up, and your other hand should be palm-down. 2. Keep your elbow straight and keep your shoulder muscles relaxed. Push the stick down with your healthy arm to raise your left / right arm in front of your body, and then over your head until you feel a stretch in your shoulder. ? Avoid shrugging your shoulder while you raise your arm. Keep your shoulder blade tucked down toward the middle of your back. 3. Hold for __________ seconds. 4. Slowly return to the starting position. Repeat __________ times. Complete this exercise __________ times a day. Exercise C: Abduction, Standing 1. Stand and hold a broomstick, a cane, or a similar object. Place your hands a little more than shoulder-width apart on the object. Your left / right hand should be palm-up, and your other hand should be palm-down. 2. While keeping your elbow straight and your shoulder muscles relaxed, push the stick across your body toward your left / right side. Raise your  left / right arm to the side of your body and then over your head until you feel a stretch in your shoulder. ? Do not raise your arm above shoulder height, unless your health care provider tells you to do that. ? Avoid shrugging your shoulder while you raise your arm. Keep your shoulder blade tucked down toward the middle of your back. 3. Hold for __________ seconds. 4. Slowly return to the starting position. Repeat  __________ times. Complete this exercise __________ times a day. Exercise D:Internal Rotation  1. Place your left / right hand behind your back, palm-up. 2. Use your other hand to dangle an exercise band, a towel, or a similar object over your shoulder. Grasp the band with your left / right hand so you are holding onto both ends. 3. Gently pull up on the band until you feel a stretch in the front of your left / right shoulder. ? Avoid shrugging your shoulder while you raise your arm. Keep your shoulder blade tucked down toward the middle of your back. 4. Hold for __________ seconds. 5. Release the stretch by letting go of the band and lowering your hands. Repeat __________ times. Complete this exercise __________ times a day. STRETCHING EXERCISES These exercises warm up your muscles and joints and improve the movement and flexibility of your shoulder. These exercises also help to relieve pain, numbness, and tingling. These exercises are done using your healthy shoulder to help stretch the muscles of your injured shoulder. Exercise E: Warehouse manager (External Rotation and Abduction)  1. Stand in a doorway with one of your feet slightly in front of the other. This is called a staggered stance. If you cannot reach your forearms to the door frame, stand facing a corner of a room. 2. Choose one of the following positions as told by your health care provider: ? Place your hands and forearms on the door frame above your head. ? Place your hands and forearms on the door frame at the height of your head. ? Place your hands on the door frame at the height of your elbows. 3. Slowly move your weight onto your front foot until you feel a stretch across your chest and in the front of your shoulders. Keep your head and chest upright and keep your abdominal muscles tight. 4. Hold for __________ seconds. 5. To release the stretch, shift your weight to your back foot. Repeat __________ times. Complete this  stretch __________ times a day. Exercise F:Extension, Standing 1. Stand and hold a broomstick, a cane, or a similar object behind your back. ? Your hands should be a little wider than shoulder-width apart. ? Your palms should face away from your back. 2. Keeping your elbows straight and keeping your shoulder muscles relaxed, move the stick away from your body until you feel a stretch in your shoulder. ? Avoid shrugging your shoulders while you move the stick. Keep your shoulder blade tucked down toward the middle of your back. 3. Hold for __________ seconds. 4. Slowly return to the starting position. Repeat __________ times. Complete this exercise __________ times a day. STRENGTHENING EXERCISES These exercises build strength and endurance in your shoulder. Endurance is the ability to use your muscles for a long time, even after they get tired. Exercise G:External Rotation  1. Sit in a stable chair without armrests. 2. Secure an exercise band at elbow height on your left / right side. 3. Place a soft object, such as a folded towel or a small pillow,  between your left / right upper arm and your body to move your elbow a few inches away (about 10 cm) from your side. 4. Hold the end of the band so it is tight and there is no slack. 5. Keeping your elbow pressed against the soft object, move your left / right forearm out, away from your abdomen. Keep your body steady so only your forearm moves. 6. Hold for __________ seconds. 7. Slowly return to the starting position. Repeat __________ times. Complete this exercise __________ times a day. Exercise H:Shoulder Abduction  1. Sit in a stable chair without armrests, or stand. 2. Hold a __________ weight in your left / right hand, or hold an exercise band with both hands. 3. Start with your arms straight down and your left / right palm facing in, toward your body. 4. Slowly lift your left / right hand out to your side. Do not lift your hand above  shoulder height unless your health care provider tells you that this is safe. ? Keep your arms straight. ? Avoid shrugging your shoulder while you do this movement. Keep your shoulder blade tucked down toward the middle of your back. 5. Hold for __________ seconds. 6. Slowly lower your arm, and return to the starting position. Repeat __________ times. Complete this exercise __________ times a day. Exercise I:Shoulder Extension 1. Sit in a stable chair without armrests, or stand. 2. Secure an exercise band to a stable object in front of you where it is at shoulder height. 3. Hold one end of the exercise band in each hand. Your palms should face each other. 4. Straighten your elbows and lift your hands up to shoulder height. 5. Step back, away from the secured end of the exercise band, until the band is tight and there is no slack. 6. Squeeze your shoulder blades together as you pull your hands down to the sides of your thighs. Stop when your hands are straight down by your sides. Do not let your hands go behind your body. 7. Hold for __________ seconds. 8. Slowly return to the starting position. Repeat __________ times. Complete this exercise __________ times a day. Exercise J:Standing Shoulder Row 1. Sit in a stable chair without armrests, or stand. 2. Secure an exercise band to a stable object in front of you so it is at waist height. 3. Hold one end of the exercise band in each hand. Your palms should be in a thumbs-up position. 4. Bend each of your elbows to an "L" shape (about 90 degrees) and keep your upper arms at your sides. 5. Step back until the band is tight and there is no slack. 6. Slowly pull your elbows back behind you. 7. Hold for __________ seconds. 8. Slowly return to the starting position. Repeat __________ times. Complete this exercise __________ times a day. Exercise K:Shoulder Press-Ups  1. Sit in a stable chair that has armrests. Sit upright, with your feet flat  on the floor. 2. Put your hands on the armrests so your elbows are bent and your fingers are pointing forward. Your hands should be about even with the sides of your body. 3. Push down on the armrests and use your arms to lift yourself off of the chair. Straighten your elbows and lift yourself up as much as you comfortably can. ? Move your shoulder blades down, and avoid letting your shoulders move up toward your ears. ? Keep your feet on the ground. As you get stronger, your feet should support less  of your body weight as you lift yourself up. 4. Hold for __________ seconds. 5. Slowly lower yourself back into the chair. Repeat __________ times. Complete this exercise __________ times a day. Exercise L: Wall Push-Ups  1. Stand so you are facing a stable wall. Your feet should be about one arm-length away from the wall. 2. Lean forward and place your palms on the wall at shoulder height. 3. Keep your feet flat on the floor as you bend your elbows and lean forward toward the wall. 4. Hold for __________ seconds. 5. Straighten your elbows to push yourself back to the starting position. Repeat __________ times. Complete this exercise __________ times a day. This information is not intended to replace advice given to you by your health care provider. Make sure you discuss any questions you have with your health care provider. Document Released: 02/04/2005 Document Revised: 12/16/2015 Document Reviewed: 12/02/2014 Elsevier Interactive Patient Education  2018 [place] Years, Male Preventive care refers to lifestyle choices and visits with your health care provider that can promote health and wellness. What does preventive care include?  A yearly physical exam. This is also called an annual well check.  Dental exams once or twice a year.  Routine eye exams. Ask your health care provider how often you should have your eyes checked.  Personal lifestyle choices,  including: ? Daily care of your teeth and gums. ? Regular physical activity. ? Eating a healthy diet. ? Avoiding tobacco and drug use. ? Limiting alcohol use. ? Practicing safe sex. ? Taking low-dose aspirin every day starting at age 42. What happens during an annual well check? The services and screenings done by your health care provider during your annual well check will depend on your age, overall health, lifestyle risk factors, and family history of disease. Counseling Your health care provider may ask you questions about your:  Alcohol use.  Tobacco use.  Drug use.  Emotional well-being.  Home and relationship well-being.  Sexual activity.  Eating habits.  Work and work Statistician.  Screening You may have the following tests or measurements:  Height, weight, and BMI.  Blood pressure.  Lipid and cholesterol levels. These may be checked every 5 years, or more frequently if you are over 52 years old.  Skin check.  Lung cancer screening. You may have this screening every year starting at age 12 if you have a 30-pack-year history of smoking and currently smoke or have quit within the past 15 years.  Fecal occult blood test (FOBT) of the stool. You may have this test every year starting at age 29.  Flexible sigmoidoscopy or colonoscopy. You may have a sigmoidoscopy every 5 years or a colonoscopy every 10 years starting at age 19.  Prostate cancer screening. Recommendations will vary depending on your family history and other risks.  Hepatitis C blood test.  Hepatitis B blood test.  Sexually transmitted disease (STD) testing.  Diabetes screening. This is done by checking your blood sugar (glucose) after you have not eaten for a while (fasting). You may have this done every 1-3 years.  Discuss your test results, treatment options, and if necessary, the need for more tests with your health care provider. Vaccines Your health care provider may recommend certain  vaccines, such as:  Influenza vaccine. This is recommended every year.  Tetanus, diphtheria, and acellular pertussis (Tdap, Td) vaccine. You may need a Td booster every 10 years.  Varicella vaccine. You may need this  if you have not been vaccinated.  Zoster vaccine. You may need this after age 8.  Measles, mumps, and rubella (MMR) vaccine. You may need at least one dose of MMR if you were born in 1957 or later. You may also need a second dose.  Pneumococcal 13-valent conjugate (PCV13) vaccine. You may need this if you have certain conditions and have not been vaccinated.  Pneumococcal polysaccharide (PPSV23) vaccine. You may need one or two doses if you smoke cigarettes or if you have certain conditions.  Meningococcal vaccine. You may need this if you have certain conditions.  Hepatitis A vaccine. You may need this if you have certain conditions or if you travel or work in places where you may be exposed to hepatitis A.  Hepatitis B vaccine. You may need this if you have certain conditions or if you travel or work in places where you may be exposed to hepatitis B.  Haemophilus influenzae type b (Hib) vaccine. You may need this if you have certain risk factors.  Talk to your health care provider about which screenings and vaccines you need and how often you need them. This information is not intended to replace advice given to you by your health care provider. Make sure you discuss any questions you have with your health care provider. Document Released: 04/19/2015 Document Revised: 12/11/2015 Document Reviewed: 01/22/2015 Elsevier Interactive Patient Education  [employer].

## 2017-05-24 NOTE — Assessment & Plan Note (Signed)
Immunizations UTD. Discussed the importance of a healthy diet and regular exercise in order for weight loss, and to reduce the risk of any potential medical problems. Exam unremarkable. Labs pending. Follow up in 1 year.

## 2017-05-24 NOTE — Assessment & Plan Note (Signed)
Lipid panel pending today, continue atorvastatin.  Discussed the importance of a healthy diet and regular exercise in order for weight loss, and to reduce the risk of any potential medical problems.

## 2017-06-03 ENCOUNTER — Other Ambulatory Visit: Payer: Self-pay | Admitting: Primary Care

## 2017-06-03 DIAGNOSIS — E785 Hyperlipidemia, unspecified: Secondary | ICD-10-CM

## 2017-06-21 ENCOUNTER — Encounter: Payer: Self-pay | Admitting: Primary Care

## 2017-06-28 ENCOUNTER — Ambulatory Visit: Payer: BLUE CROSS/BLUE SHIELD | Admitting: Family Medicine

## 2017-06-28 ENCOUNTER — Encounter: Payer: Self-pay | Admitting: Family Medicine

## 2017-06-28 ENCOUNTER — Other Ambulatory Visit: Payer: Self-pay

## 2017-06-28 VITALS — BP 130/82 | HR 84 | Temp 98.3°F | Ht 71.0 in | Wt 223.0 lb

## 2017-06-28 DIAGNOSIS — M7542 Impingement syndrome of left shoulder: Secondary | ICD-10-CM

## 2017-06-28 DIAGNOSIS — M7582 Other shoulder lesions, left shoulder: Secondary | ICD-10-CM

## 2017-06-28 NOTE — Progress Notes (Signed)
Dr. Frederico Hamman T. Riot Waterworth, MD, [facility] [place], [address] Phone: [phone] Fax: [phone]  06/28/2017  Patient: Arthur Stevens, MRN: [id number], DOB: [birth year], 42 y.o.  Primary Physician:  Pleas Koch, NP   Chief Complaint  Patient presents with  . Shoulder Pain    Left   Subjective:   This 42 y.o. male patient noted above presents with shoulder pain that has been ongoing for approx 3 mo. there is no history of trauma or accident recently The patient denies neck pain or radicular symptoms. Denies dislocation, subluxation, separation of the shoulder. The patient does complain of pain in the overhead plane with significant painful arc of motion.  Months ago, decreased abduction, and came in to see Anda Kraft in Feb and did some ROM exercise. 18 holes per day.   Medications Tried: Tylenol, NSAIDS Ice or Heat: minimally helpful Tried PT: No, HEP but confused about it  Prior shoulder Injury: No Prior surgery: No Prior fracture: No  The PMH, PSH, Social History, Family History, Medications, and allergies have been reviewed in [facility], and have been updated if relevant.  Patient Active Problem List   Diagnosis Date Noted  . Prediabetes 05/24/2017  . Hyperlipidemia 04/18/2015  . Preventative health care 04/18/2015    Past Medical History:  Diagnosis Date  . Allergy     History reviewed. No pertinent surgical history.  Social History   Socioeconomic History  . Marital status: Married    Spouse name: Not on file  . Number of children: Not on file  . Years of education: Not on file  . Highest education level: Not on file  Occupational History  . Not on file  Social Needs  . Financial resource strain: Not on file  . Food insecurity:    Worry: Not on file    Inability: Not on file  . Transportation needs:    Medical: Not on file    Non-medical: Not on file  Tobacco Use  . Smoking status: Never  Smoker  . Smokeless tobacco: Never Used  Substance and Sexual Activity  . Alcohol use: Yes  . Drug use: No  . Sexual activity: Yes    Birth control/protection: None  Lifestyle  . Physical activity:    Days per week: Not on file    Minutes per session: Not on file  . Stress: Not on file  Relationships  . Social connections:    Talks on phone: Not on file    Gets together: Not on file    Attends religious service: Not on file    Active member of club or organization: Not on file    Attends meetings of clubs or organizations: Not on file    Relationship status: Not on file  . Intimate partner violence:    Fear of current or ex partner: Not on file    Emotionally abused: Not on file    Physically abused: Not on file    Forced sexual activity: Not on file  Other Topics Concern  . Not on file  Social History Narrative   Married.   2 children.   Works as a Financial risk analyst.   Enjoys spending time with children, playing golf    Family History  Problem Relation Age of Onset  . Hyperlipidemia Mother   . Hypertension Mother   . Cancer Sister     No Known Allergies  Medication list reviewed and updated in full  in [facility].  GEN: No fevers, chills. Nontoxic. Primarily MSK c/o today. MSK: Detailed in the HPI GI: tolerating PO intake without difficulty Neuro: No numbness, parasthesias, or tingling associated. Otherwise the pertinent positives of the ROS are noted above.   Objective:   Blood pressure 130/82, pulse 84, temperature 98.3 F (36.8 C), temperature source Oral, height 5\' 11"  (1.803 m), weight 223 lb (101.2 kg).  GEN: Well-developed,well-nourished,in no acute distress; alert,appropriate and cooperative throughout examination HEENT: Normocephalic and atraumatic without obvious abnormalities. Ears, externally no deformities PULM: Breathing comfortably in no respiratory distress EXT: No clubbing, cyanosis, or edema PSYCH: Normally interactive. Cooperative  during the interview. Pleasant. Friendly and conversant. Not anxious or depressed appearing. Normal, full affect.  Shoulder: L Inspection: No muscle wasting or winging Ecchymosis/edema: neg  AC joint, scapula, clavicle: NT Cervical spine: NT, full ROM Spurling's: neg Abduction: full, 5/5 Flexion: full, 5/5 IR, full, lift-off: 5/5 ER at neutral: full, 5/5 AC crossover: neg Neer: very mild pos Hawkins: very mild pos Drop Test: neg Empty Can: mild pos Supraspinatus insertion: mild-mod T Bicipital groove: NT Speed's: neg Yergason's: neg Sulcus sign: neg Scapular dyskinesis: none C5-T1 intact  Neuro: Sensation intact Grip 5/5   Radiology: No results found.  Assessment and Plan:    Impingement syndrome of left shoulder - Plan: Ambulatory referral to Physical Therapy  Rotator cuff tendonitis, left - Plan: Ambulatory referral to Physical Therapy  Rel mild RTC tendonitis and subac bursitis.  With high volume golf trip this week, I would not inject his shoulder .  The patient could benefit from formal PT to assist with scapular stabilization and RTC strengthening.   Follow-up: Return in about 6 weeks (around 08/09/2017).  Orders Placed This Encounter  Procedures  . Ambulatory referral to Physical Therapy    Signed,  Frederico Hamman T. Vinessa Macconnell, MD   Patient's Medications  New Prescriptions   No medications on file  Previous Medications   ASCORBIC ACID (VITAMIN C) 100 MG TABLET    Take 100 mg by mouth daily.   ATORVASTATIN (LIPITOR) 20 MG TABLET    TAKE 1 TABLET (20 MG TOTAL) BY MOUTH EVERY EVENING.   CETIRIZINE (ZYRTEC) 10 MG TABLET    Take 10 mg by mouth daily.   DIPHENHYDRAMINE-ACETAMINOPHEN (TYLENOL PM) 25-500 MG TABS    Take 1 tablet by mouth at bedtime as needed.   IBUPROFEN (ADVIL,MOTRIN) 200 MG TABLET    Take 200 mg by mouth every 6 (six) hours as needed.   MULTIPLE VITAMIN (MULTIVITAMIN) TABLET    Take 1 tablet by mouth daily.   NAPROXEN (NAPROSYN) 500 MG TABLET     Take 1 tablet (500 mg total) by mouth 2 (two) times daily with a meal.   ZINC GLUCONATE 50 MG TABLET    Take 50 mg by mouth daily.  Modified Medications   No medications on file  Discontinued Medications   No medications on file

## 2017-07-09 DIAGNOSIS — M25512 Pain in left shoulder: Secondary | ICD-10-CM | POA: Diagnosis not present

## 2017-07-09 DIAGNOSIS — M7542 Impingement syndrome of left shoulder: Secondary | ICD-10-CM | POA: Diagnosis not present

## 2017-07-16 DIAGNOSIS — M7542 Impingement syndrome of left shoulder: Secondary | ICD-10-CM | POA: Diagnosis not present

## 2017-07-16 DIAGNOSIS — M25512 Pain in left shoulder: Secondary | ICD-10-CM | POA: Diagnosis not present

## 2017-07-27 DIAGNOSIS — M7542 Impingement syndrome of left shoulder: Secondary | ICD-10-CM | POA: Diagnosis not present

## 2017-07-27 DIAGNOSIS — M25512 Pain in left shoulder: Secondary | ICD-10-CM | POA: Diagnosis not present

## 2017-08-09 ENCOUNTER — Ambulatory Visit: Payer: BLUE CROSS/BLUE SHIELD | Admitting: Family Medicine

## 2017-08-12 DIAGNOSIS — D2230 Melanocytic nevi of unspecified part of face: Secondary | ICD-10-CM | POA: Diagnosis not present

## 2017-08-12 DIAGNOSIS — L814 Other melanin hyperpigmentation: Secondary | ICD-10-CM | POA: Diagnosis not present

## 2017-08-12 DIAGNOSIS — L821 Other seborrheic keratosis: Secondary | ICD-10-CM | POA: Diagnosis not present

## 2017-08-12 DIAGNOSIS — D225 Melanocytic nevi of trunk: Secondary | ICD-10-CM | POA: Diagnosis not present

## 2017-11-02 ENCOUNTER — Other Ambulatory Visit: Payer: Self-pay

## 2017-11-02 ENCOUNTER — Emergency Department
Admission: EM | Admit: 2017-11-02 | Discharge: 2017-11-02 | Disposition: A | Discharge status: Home / Self Care | Payer: BLUE CROSS/BLUE SHIELD | Attending: Emergency Medicine | Admitting: Emergency Medicine

## 2017-11-02 ENCOUNTER — Emergency Department: Payer: BLUE CROSS/BLUE SHIELD

## 2017-11-02 ENCOUNTER — Encounter: Payer: Self-pay | Admitting: Emergency Medicine

## 2017-11-02 ENCOUNTER — Ambulatory Visit: Payer: Self-pay

## 2017-11-02 ENCOUNTER — Ambulatory Visit: Payer: BLUE CROSS/BLUE SHIELD | Admitting: Internal Medicine

## 2017-11-02 DIAGNOSIS — R1030 Lower abdominal pain, unspecified: Secondary | ICD-10-CM | POA: Diagnosis not present

## 2017-11-02 DIAGNOSIS — R109 Unspecified abdominal pain: Secondary | ICD-10-CM

## 2017-11-02 DIAGNOSIS — R1031 Right lower quadrant pain: Secondary | ICD-10-CM | POA: Diagnosis not present

## 2017-11-02 DIAGNOSIS — Z79899 Other long term (current) drug therapy: Secondary | ICD-10-CM | POA: Diagnosis not present

## 2017-11-02 DIAGNOSIS — K429 Umbilical hernia without obstruction or gangrene: Secondary | ICD-10-CM | POA: Diagnosis not present

## 2017-11-02 HISTORY — DX: Hyperlipidemia, unspecified: E78.5

## 2017-11-02 LAB — COMPREHENSIVE METABOLIC PANEL
ALBUMIN: 5 g/dL (ref 3.5–5.0)
ALT: 48 U/L — ABNORMAL HIGH (ref 0–44)
ANION GAP: 9 (ref 5–15)
AST: 33 U/L (ref 15–41)
Alkaline Phosphatase: 48 U/L (ref 38–126)
BUN: 11 mg/dL (ref 6–20)
CALCIUM: 9.5 mg/dL (ref 8.9–10.3)
CO2: 26 mmol/L (ref 22–32)
CREATININE: 0.93 mg/dL (ref 0.61–1.24)
Chloride: 102 mmol/L (ref 98–111)
GFR calc Af Amer: >60 mL/min (ref >60)
GFR calc non Af Amer: >60 mL/min (ref >60)
Glucose, Bld: 107 mg/dL — ABNORMAL HIGH (ref 70–99)
Potassium: 3.7 mmol/L (ref 3.5–5.1)
SODIUM: 137 mmol/L (ref 135–145)
TOTAL PROTEIN: 8 g/dL (ref 6.5–8.1)
Total Bilirubin: 0.8 mg/dL (ref 0.3–1.2)

## 2017-11-02 LAB — CBC
HCT: 45.2 % (ref 40.0–52.0)
Hemoglobin: 15.6 g/dL (ref 13.0–18.0)
MCH: 29.8 pg (ref 26.0–34.0)
MCHC: 34.6 g/dL (ref 32.0–36.0)
MCV: 86.3 fL (ref 80.0–100.0)
PLATELETS: 208 10*3/uL (ref 150–440)
RBC: 5.24 MIL/uL (ref 4.40–5.90)
RDW: 13.2 % (ref 11.5–14.5)
WBC: 10 10*3/uL (ref 3.8–10.6)

## 2017-11-02 LAB — URINALYSIS, COMPLETE (UACMP) WITH MICROSCOPIC
BILIRUBIN URINE: NEGATIVE
Bacteria, UA: NONE SEEN
GLUCOSE, UA: NEGATIVE mg/dL
HGB URINE DIPSTICK: NEGATIVE
Ketones, ur: NEGATIVE mg/dL
Leukocytes, UA: NEGATIVE
NITRITE: NEGATIVE
PROTEIN: NEGATIVE mg/dL
SPECIFIC GRAVITY, URINE: 1.017 (ref 1.005–1.030)
Squamous Epithelial / LPF: NONE SEEN (ref 0–5)
pH: 6 (ref 5.0–8.0)

## 2017-11-02 LAB — LIPASE, BLOOD: LIPASE: 29 U/L (ref 11–51)

## 2017-11-02 MED ORDER — IBUPROFEN 600 MG PO TABS: 600.0000 mg | ORAL_TABLET | Freq: Four times a day (QID) | ORAL | 0 refills | Status: DC | PRN

## 2017-11-02 MED ORDER — KETOROLAC TROMETHAMINE 30 MG/ML IJ SOLN
30.0000 mg | Freq: Once | INTRAMUSCULAR | Status: AC
  Administered 2017-11-02: 30 mg via INTRAVENOUS
  Filled 2017-11-02: qty 1

## 2017-11-02 NOTE — Discharge Instructions (Addendum)
Is unclear what is causing flank pain today, you have a very reassuring work-up here.  There is no evidence at this time of appendicitis or other intra-abdominal pathology of CT urine and blood work are all reassuring.  This is not me nothing is wrong obviously it just means that thus far tests are very reassuring.  If you have increased pain, fever, chills, nausea, vomiting that is persistent or any other concerns including numbness, weakness, shortness of breath, chest pain or you feel worse in any way return to the emergency department.  Otherwise, please follow closely with her primary care doctor in the next day or so

## 2017-11-02 NOTE — ED Provider Notes (Signed)
-----------------------------------------   9:09 PM on 11/02/2017 -----------------------------------------  I took over care of this patient from Dr. Burlene Arnt.  The patient was pending reassessment after Toradol.  On reassessment, the patient was actually asleep and when I woke him he stated that he felt significantly better.  He appeared very comfortable, and wanted to go home.  I answered all of his questions, discussed the results of the work-up, and gave him return precautions.  The patient expressed understanding.  He is stable for discharge at this time.   Arta Silence, MD 11/02/17 [id number]

## 2017-11-02 NOTE — ED Notes (Signed)
Pt states that he is coming in for R flank pain that started this morning. No injury that he is aware of. No urinary complaints. No hx of kidney stones or infections. No diarrhea or constipation. No numbness/tingling/weakness in RLE. He did have 1 episode of vomiting this morning. +chills. Only PMH high cholesterol. Nothing like this has happened before.

## 2017-11-02 NOTE — ED Triage Notes (Signed)
Pt presents with rlq pain that seems to have radiated to right flank. Denies urinary symptoms. Pt states he has had one episode of emesis. Pt alert & oriented; nad noted.

## 2017-11-02 NOTE — ED Provider Notes (Addendum)
[facility] Emergency Department Provider Note  ____________________________________________   I have reviewed the triage vital signs and the nursing notes. Where available I have reviewed prior notes and, if possible and indicated, outside hospital notes.    HISTORY  Chief Complaint Abdominal Pain    HPI Arthur Stevens is a 42 y.o. male  Who is healthy, and today complaining of pain in his right side going towards his groin.  Began this morning.  Vomited once.  Denies any fever chills no numbness no weakness no chest pain or shortness of breath no pleuritic pain, it is a cramping discomfort he can identify the exact spot that it mostly frequents which is in the right lower flank.  No numbness no weakness no incontinence of bowel or bladder, gradual onset.  Has not had this before.  No prior history of this.  No right upper quadrant pain, he did have some abdominal pain but that is gone.  No testicular pain or swelling, no other complaints.    Past Medical History:  Diagnosis Date  . Allergy   . Hyperlipidemia     Patient Active Problem List   Diagnosis Date Noted  . Prediabetes 05/24/2017  . Hyperlipidemia 04/18/2015  . Preventative health care 04/18/2015    History reviewed. No pertinent surgical history.  Prior to Admission medications   Medication Sig Start Date End Date Taking? Authorizing Provider  Ascorbic Acid (VITAMIN C) 100 MG tablet Take 100 mg by mouth daily.    [provider]  atorvastatin (LIPITOR) 20 MG tablet TAKE 1 TABLET (20 MG TOTAL) BY MOUTH EVERY EVENING. 06/03/17   Pleas Koch, NP  cetirizine (ZYRTEC) 10 MG tablet Take 10 mg by mouth daily.    [provider]  diphenhydramine-acetaminophen (TYLENOL PM) 25-500 MG TABS Take 1 tablet by mouth at bedtime as needed.    [provider]  ibuprofen (ADVIL,MOTRIN) 200 MG tablet Take 200 mg by mouth every 6 (six) hours as needed.    [provider]   Multiple Vitamin (MULTIVITAMIN) tablet Take 1 tablet by mouth daily.    [provider]  naproxen (NAPROSYN) 500 MG tablet Take 1 tablet (500 mg total) by mouth 2 (two) times daily with a meal. 05/24/17   Pleas Koch, NP  zinc gluconate 50 MG tablet Take 50 mg by mouth daily.    [provider]    Allergies Patient has no known allergies.  Family History  Problem Relation Age of Onset  . Hyperlipidemia Mother   . Hypertension Mother   . Cancer Sister     Social History Social History   Tobacco Use  . Smoking status: Never Smoker  . Smokeless tobacco: Never Used  Substance Use Topics  . Alcohol use: Yes    Comment: 2 x beers per day  . Drug use: No    Review of Systems Constitutional: No fever/chills Eyes: No visual changes. ENT: No sore throat. No stiff neck no neck pain Cardiovascular: Denies chest pain. Respiratory: Denies shortness of breath. Gastrointestinal:   + vomiting.  No diarrhea.  No constipation. Genitourinary: Negative for dysuria. Musculoskeletal: Negative lower extremity swelling Skin: Negative for rash. Neurological: Negative for severe headaches, focal weakness or numbness.   ____________________________________________   PHYSICAL EXAM:  VITAL SIGNS: ED Triage Vitals  Enc Vitals Group     BP 11/02/17 1610 (!) 165/100     Pulse Rate 11/02/17 1610 72     Resp 11/02/17 1610 18  Temp 11/02/17 1610 98.5 F (36.9 C)     Temp Source 11/02/17 1610 Oral     SpO2 11/02/17 1610 97 %     Weight 11/02/17 1610 225 lb (102.1 kg)     Height 11/02/17 1610 6' (1.829 m)     Head Circumference --      Peak Flow --      Pain Score 11/02/17 1627 6     Pain Loc --      Pain Edu? --      Excl. in [place]? --     Constitutional: Alert and oriented. Well appearing and in no acute distress.  He did asked me to defer pain medication pending imaging as he would like to drive home Eyes: Conjunctivae are normal Head: Atraumatic HEENT: No  congestion/rhinnorhea. Mucous membranes are moist.  Oropharynx non-erythematous Neck:   Nontender with no meningismus, no masses, no stridor Cardiovascular: Normal rate, regular rhythm. Grossly normal heart sounds.  Good peripheral circulation. Respiratory: Normal respiratory effort.  No retractions. Lungs CTAB. Abdominal: Soft and nontender. No distention. No guarding no rebound can deeply palpate in all quadrants without eliciting any tenderness. Back:  There is no focal tenderness or step off.  there is no midline tenderness there are no lesions noted there is focal right-sided EVA tenderness, slightly low.  No midline tenderness no rashes or lesions noted  GU: Normal genitalia with no tenderness masses or lesions noted musculoskeletal: No lower extremity tenderness, no upper extremity tenderness. No joint effusions, no DVT signs strong distal pulses no edema Neurologic:  Normal speech and language. No gross focal neurologic deficits are appreciated.  Skin:  Skin is warm, dry and intact. No rash noted. Psychiatric: Mood and affect are normal. Speech and behavior are normal.  ____________________________________________   LABS (all labs ordered are listed, but only abnormal results are displayed)  Labs Reviewed  COMPREHENSIVE METABOLIC PANEL - Abnormal; Notable for the following components:      Result Value   Glucose, Bld 107 (*)    ALT 48 (*)    All other components within normal limits  URINALYSIS, COMPLETE (UACMP) WITH MICROSCOPIC - Abnormal; Notable for the following components:   Color, Urine YELLOW (*)    APPearance CLEAR (*)    All other components within normal limits  LIPASE, BLOOD  CBC    Pertinent labs  results that were available during my care of the patient were reviewed by me and considered in my medical decision making (see chart for details). ____________________________________________  EKG  I personally interpreted any EKGs ordered by me or  triage  ____________________________________________  RADIOLOGY  Pertinent labs & imaging results that were available during my care of the patient were reviewed by me and considered in my medical decision making (see chart for details). If possible, patient and/or family made aware of any abnormal findings.  Ct Renal Stone Study  Result Date: 11/02/2017 CLINICAL DATA:  Right lower quadrant pain radiating into right flank. EXAM: CT ABDOMEN AND PELVIS WITHOUT CONTRAST TECHNIQUE: Multidetector CT imaging of the abdomen and pelvis was performed following the standard protocol without IV contrast. COMPARISON:  None. FINDINGS: Lower chest: No acute abnormality. Hepatobiliary: No focal liver abnormality is seen. No gallstones, gallbladder wall thickening, or biliary dilatation. Pancreas: Unremarkable. No pancreatic ductal dilatation or surrounding inflammatory changes. Spleen: Low-attenuation lesion the spleen on series 2, image 25 is likely a small cyst or hemangioma. Adrenals/Urinary Tract: Adrenal glands are unremarkable. Kidneys are normal, without renal calculi, focal  lesion, or hydronephrosis. Bladder is unremarkable. Stomach/Bowel: Stomach is within normal limits. Appendix appears normal. No evidence of bowel wall thickening, distention, or inflammatory changes. Vascular/Lymphatic: No significant vascular findings are present. No enlarged abdominal or pelvic lymph nodes. Reproductive: Prostate is unremarkable. Other: There is a fat containing left inguinal hernia which is relatively small. There is a fat containing umbilical hernia. No free air or free fluid. Musculoskeletal: No acute or significant osseous findings. IMPRESSION: 1. No acute abnormality to explain the patient's pain. 2. Fat containing left inguinal hernia. Fat containing umbilical hernia. Both hernias are small. Electronically Signed   By: Dorise Bullion III M.D   On: 11/02/2017 19:51   ____________________________________________     PROCEDURES  Procedure(s) performed: None  Procedures  Critical Care performed: None  ____________________________________________   INITIAL IMPRESSION / ASSESSMENT AND PLAN / ED COURSE  Pertinent labs & imaging results that were available during my care of the patient were reviewed by me and considered in my medical decision making (see chart for details).  Here with right sided abdominal pain radiating to his groin.  Now in his right flank.  Most consistent with kidney stone, patient appears mildly uncomfortable but not significant.  Is a cramping frequent discomfort.  Is not pleuritic.  He has no chest pain no shortness of breath as part below his lungs on exam and is very reproducible.  Differential does include kidney stone, gallbladder disease, diverticular disease, pyelonephritis etc.  However, patient has negative pancreatic enzymes negative liver function test with exception of ALT which is only trivially up, alk phos is normal, white count is normal despite symptoms all day, and urinalysis is completely normal.  This makes all of the above considerations much less likely nonetheless we did do a CT scan.  No abdominal tenderness to suggest that he has gallbladder disease or appendicitis and CT scan shows no evidence of either 1 of these diseases either.  Both are well visualized and neither show any evidence of disease.  She remains uncomfortable does consent to pain medication, given I do not see evidence of AAA or other sniffing and pathology, will give him Toradol to see if this helps.  Most likely at this time is muscular skeletal disease.  Sometimes PE can present with flank pain, however, patient has no pleuritic pain no shortness of breath no evidence of PE and no known risk factors for PE and neither in himself or his family.  Do not think further imaging is warranted to look for something I do not think is there.  We will give him Toradol, most likely therefore at this time  whether he passed a kidney stone with some residual discomfort which is not terribly likely but certainly possible or is having muscular skeletal pain.  I cannot also discount entirely the idea that perhaps he has thoracolumbar radiculopathy but there is no evidence of cauda equina syndrome.  We will see how he feels after pain medication, there is certainly nothing to suggest ACS or dissection. The patient has a very large stool burden in his colon which could certainly cause discomfort crampy discomfort that he is having. ----------------------------------------- 8:23 PM on 11/02/2017 -----------------------------------------  Signed out to Dr. Eula Listen at the end of my shift.    ____________________________________________   FINAL CLINICAL IMPRESSION(S) / ED DIAGNOSES  Final diagnoses:  None      This chart was dictated using voice recognition software.  Despite best efforts to proofread,  errors can occur which can change  meaning.      Schuyler Amor, MD 11/02/17 2014    Schuyler Amor, MD 11/02/17 2026

## 2017-11-02 NOTE — Telephone Encounter (Signed)
Patient triaged at [facility]. Awaiting treatment.

## 2017-11-02 NOTE — Telephone Encounter (Signed)
Outgoing  Call to patient who states that he is experiencing dull pain on on the lower right side. Pain started this am.  States pain is constant.   Has never had this pain before.  Vomited this am. Just has be sipping on coke . Complains of nausea.  Provided care advice.  Looking for appointment for provider.  Non available.Patient requested another office  In [place].   Found another office. Made another appointment.  When calling Patient to inform of appointment. He stated that pain had increased. asked if he should go to ER or Urgent Care. Recommended  The ER.  Stated that he would go to ER. Reason for Disposition . [1] MODERATE pain (e.g., interferes with normal activities) AND [2] comes and goes (cramps) AND [3] present > 24 hours  (Exception: pain with Vomiting or Diarrhea - see that Guideline)  Answer Assessment - Initial Assessment Questions 1. LOCATION: "Where does it hurt?"      Right side  2. RADIATION: "Does the pain shoot anywhere else?" (e.g., chest, back)     no 3. ONSET: "When did the pain begin?" (e.g., minutes, hours or days ago)      This am 4. SUDDEN: "Gradual or sudden onset?"     Dull pain 5. PATTERN "Does the pain come and go, or is it constant?"    - If constant: "Is it getting better, staying the same, or worsening?"      (Note: Constant means the pain never goes away completely; most serious pain is constant and it progresses)     - If intermittent: "How long does it last?" "Do you have pain now?"     (Note: Intermittent means the pain goes away completely between bouts)     constant 6. SEVERITY: "How bad is the pain?"  (e.g., Scale 1-10; mild, moderate, or severe)    - MILD (1-3): doesn't interfere with normal activities, abdomen soft and not tender to touch     - MODERATE (4-7): interferes with normal activities or awakens from sleep, tender to touch     - SEVERE (8-10): excruciating pain, doubled over, unable to do any normal activities       Mild to  moderate 7. RECURRENT SYMPTOM: "Have you ever had this type of abdominal pain before?" If so, ask: "When was the last time?" and "What happened that time?"      no 8. AGGRAVATING FACTORS: "Does anything seem to cause this pain?" (e.g., foods, stress, alcohol)     Have not eaten 9. CARDIAC SYMPTOMS: "Do you have any of the following symptoms: chest pain, difficulty breathing, sweating, nausea?"     nausea 10. OTHER SYMPTOMS: "Do you have any other symptoms?" (e.g., fever, vomiting, diarrhea)       Vomiting,  11. PREGNANCY: "Is there any chance you are pregnant?" "When was your last menstrual period?"       na  Protocols used: ABDOMINAL PAIN - UPPER-A-AH

## 2017-11-02 NOTE — Telephone Encounter (Signed)
Per chart review tab pt is at [facility] ED now. 

## 2017-11-03 ENCOUNTER — Ambulatory Visit: Payer: BLUE CROSS/BLUE SHIELD | Admitting: Internal Medicine

## 2017-11-03 ENCOUNTER — Encounter: Payer: Self-pay | Admitting: Primary Care

## 2017-11-04 ENCOUNTER — Encounter: Payer: Self-pay | Admitting: Primary Care

## 2017-11-12 ENCOUNTER — Ambulatory Visit: Payer: BLUE CROSS/BLUE SHIELD | Admitting: Primary Care

## 2017-11-12 ENCOUNTER — Encounter: Payer: Self-pay | Admitting: Primary Care

## 2017-11-12 VITALS — BP 130/86 | HR 85 | Temp 98.1°F | Ht 72.0 in | Wt 219.0 lb

## 2017-11-12 DIAGNOSIS — R945 Abnormal results of liver function studies: Secondary | ICD-10-CM

## 2017-11-12 DIAGNOSIS — R1011 Right upper quadrant pain: Secondary | ICD-10-CM

## 2017-11-12 DIAGNOSIS — R7989 Other specified abnormal findings of blood chemistry: Secondary | ICD-10-CM

## 2017-11-12 NOTE — Progress Notes (Signed)
Subjective:    Patient ID: Arthur Stevens, male    DOB: 01-18-1976, 42 y.o.   MRN: [id number]  HPI  Mr. Hammitt is a 42 year old male who presents today for emergency department follow up.  He presented to [facility] ED on 11/02/17 with complaints of right sided upper abdominal pain with radiation to right groin and also to right flank that began earlier that morning. He vomited once, no fevers.   He underwent lab testing with normal WBC count, normal lipase, slight elevation in ALT, normal ALK phos, normal UA. He underwent CT scan of abdomen which was negative for gallbladder and appendix involvement. He was noted to have a large amount of stool burden. He was suspected to be at low risk for PE given lack of SOB and pleuritic chest pain. His symptoms were suspected to be MSK from potentially passing a renal stone, he was provided with Toradol. It was also suspected that his symptoms may be from thoracolumbar radiculopathy, negative cauda equina syndrome.   He was discharged home later that evening as he endorsed feeling "significantly better".   Since his ED visit he's feeling better. He did vomit once when he got home, continued to experience pain but this has gradually improved. He now describes a "discomfort" which is mostly located to the right upper quadrant. He has noticed some nausea after eating "heavier" meals. His last bowel movement was this morning, did notice some constipation just after discharge. This has improved.   BP Readings from Last 3 Encounters:  11/12/17 130/86  11/02/17 (!) 153/93  06/28/17 130/82     Review of Systems  Constitutional: Negative for fever.  Gastrointestinal: Positive for nausea. Negative for blood in stool and constipation.       Abdominal "discomfort", denies pain  Genitourinary: Negative for dysuria, flank pain, frequency and hematuria.  Musculoskeletal: Negative for back pain.  Skin: Negative for color change.       Past Medical History:    Diagnosis Date  . Allergy   . Hyperlipidemia      Social History   Socioeconomic History  . Marital status: Married    Spouse name: Not on file  . Number of children: Not on file  . Years of education: Not on file  . Highest education level: Not on file  Occupational History  . Not on file  Social Needs  . Financial resource strain: Not on file  . Food insecurity:    Worry: Not on file    Inability: Not on file  . Transportation needs:    Medical: Not on file    Non-medical: Not on file  Tobacco Use  . Smoking status: Never Smoker  . Smokeless tobacco: Never Used  Substance and Sexual Activity  . Alcohol use: Yes    Comment: 2 x beers per day  . Drug use: No  . Sexual activity: Yes    Birth control/protection: None  Lifestyle  . Physical activity:    Days per week: Not on file    Minutes per session: Not on file  . Stress: Not on file  Relationships  . Social connections:    Talks on phone: Not on file    Gets together: Not on file    Attends religious service: Not on file    Active member of club or organization: Not on file    Attends meetings of clubs or organizations: Not on file    Relationship status: Not on file  .  Intimate partner violence:    Fear of current or ex partner: Not on file    Emotionally abused: Not on file    Physically abused: Not on file    Forced sexual activity: Not on file  Other Topics Concern  . Not on file  Social History Narrative   Married.   2 children.   Works as a Financial risk analyst.   Enjoys spending time with children, playing golf    No past surgical history on file.  Family History  Problem Relation Age of Onset  . Hyperlipidemia Mother   . Hypertension Mother   . Cancer Sister     No Known Allergies  Current Outpatient Medications on File Prior to Visit  Medication Sig Dispense Refill  . Ascorbic Acid (VITAMIN C) 100 MG tablet Take 100 mg by mouth daily.    Marland Kitchen atorvastatin (LIPITOR) 20 MG tablet TAKE 1  TABLET (20 MG TOTAL) BY MOUTH EVERY EVENING. 90 tablet 3  . cetirizine (ZYRTEC) 10 MG tablet Take 10 mg by mouth daily.    . diphenhydramine-acetaminophen (TYLENOL PM) 25-500 MG TABS Take 1 tablet by mouth at bedtime as needed.    Marland Kitchen ibuprofen (ADVIL,MOTRIN) 600 MG tablet Take 1 tablet (600 mg total) by mouth every 6 (six) hours as needed. 30 tablet 0  . Multiple Vitamin (MULTIVITAMIN) tablet Take 1 tablet by mouth daily.    . naproxen (NAPROSYN) 500 MG tablet Take 1 tablet (500 mg total) by mouth 2 (two) times daily with a meal. 30 tablet 0  . zinc gluconate 50 MG tablet Take 50 mg by mouth daily.     No current facility-administered medications on file prior to visit.     BP 130/86   Pulse 85   Temp 98.1 F (36.7 C) (Oral)   Ht 6' (1.829 m)   Wt 219 lb (99.3 kg)   SpO2 97%   BMI 29.70 kg/m    Objective:   Physical Exam  Constitutional: He appears well-nourished.  Neck: Neck supple.  Cardiovascular: Normal rate.  Respiratory: Effort normal.  GI: Soft. Bowel sounds are normal. There is no tenderness. There is no CVA tenderness.  Musculoskeletal: Normal range of motion.  Skin: Skin is warm and dry.           Assessment & Plan:  Abdominal Pain:  Evaluated and treated at [facility] ED. CT scan negative and work up without obvious reason for symptoms. Exam today benign, he is improved overall. Suspect constipation vs MSK involvement to be cause, although it is possible he may have passed a renal stone. Discussed treatment for constipation, avoid fatty/fried foods. He will up date if symptoms return, consider HIDA scan for further evaluation.   All hospital labs, imaging, notes reviewed. Repeat LFT's in 3 months, work on healthy diet and exercise.  Pleas Koch, NP

## 2017-11-12 NOTE — Patient Instructions (Signed)
You can try Miralax daily for 1-2 weeks if needed for constipation.  Limit fatty/fried/spicy/acidic foods for at least another week.  Please notify me if your symptoms return as discussed.  Schedule a lab only appointment for 3 months to repeat your liver enzyme test.  It was a pleasure to see you today!

## 2018-04-05 ENCOUNTER — Ambulatory Visit: Payer: BLUE CROSS/BLUE SHIELD | Admitting: Family Medicine

## 2018-04-05 VITALS — BP 120/84 | HR 89 | Temp 99.0°F | Ht 72.0 in | Wt 221.2 lb

## 2018-04-05 DIAGNOSIS — R030 Elevated blood-pressure reading, without diagnosis of hypertension: Secondary | ICD-10-CM

## 2018-04-05 DIAGNOSIS — M25561 Pain in right knee: Secondary | ICD-10-CM

## 2018-04-05 DIAGNOSIS — I10 Essential (primary) hypertension: Secondary | ICD-10-CM | POA: Insufficient documentation

## 2018-04-05 HISTORY — DX: Pain in right knee: M25.561

## 2018-04-05 MED ORDER — DICLOFENAC SODIUM 75 MG PO TBEC: 75.0000 mg | DELAYED_RELEASE_TABLET | Freq: Two times a day (BID) | ORAL | 0 refills | Status: DC

## 2018-04-05 NOTE — Assessment & Plan Note (Signed)
Hx of medically managed  Meniscal tear.. 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 Treat with diclofenac BID,  Ice,  Elevation and bracing, start home PT ( info given). If not improving in 1-2 weeks given upcoming trip to [place] in 19 days call for  possible X-ray or referral for further treatment.

## 2018-04-05 NOTE — Patient Instructions (Signed)
Get arm blood pressure cuff, regular size.  Follow BP at home.. Goal < 140/90.  Call with measurement in 1-2 weeks.  Decrease alcohol and salt in diet, work on weight loss and low impact exercise.  Hold ibuprofen and aleve.  Start diclofenac twice daily x 1-2 weeks for right knee pain.  Start home PT.  Wear brace, elevate knee when sitting, can ice as needed.

## 2018-04-05 NOTE — Progress Notes (Signed)
Subjective:    Patient ID: Arthur Stevens, male    DOB: [birth year], 42 y.o.   MRN: [id number]  HPI  42 year old male pt of Tawni Millers presents for persistently elevated BPs and right knee pain.  1. Elevated BPs with no previous Dx HTN: Did have BP elevation in 10/2017 per chart but this was at ED with flank pain. He has noted elevations on  140-153/95-100.. uses wrist cuff.  At [facility] once 160/ 90. He has been under more stress lately. No chest pain, no SOB, no dizziness, no headache, no vision change, no swelling.   No  new meds.  Al little more ETOH over the holidays... 2-3 beers on weekend, some soda,  No increase in salt.   No decongestant. Wt Readings from Last 3 Encounters:  04/05/18 221 lb 4 oz (100.4 kg)  11/12/17 219 lb (99.3 kg)  11/02/17 225 lb (102.1 kg)   Occ feels chest warm, no palpitation or pain, no association with exertion.   BP Readings from Last 3 Encounters:  04/05/18 120/84  11/12/17 130/86  11/02/17 (!) 153/93   Using ibuprofen ( 2 daily) regularly which can elevated BPs.  2. Right knee pain: injured with  Trying to start running,  Has noted swelling in right knee.  Walking more lately, sore all over, limping some but can weight bear. No redness.   No known injury.  Hx  In past of soccer injury 10 years ag, meniscal tear. .. got PT improved on own. No popping ,clicking. No giving way.  Using ibuprofen.. helped some.  Doing home stretches.   Social History /Family History/Past Medical History reviewed in detail and updated in EMR if needed.  Blood pressure 120/84, pulse 89, temperature 99 F (37.2 C), temperature source Oral, height 6' (1.829 m), weight 221 lb 4 oz (100.4 kg).  Review of Systems  Constitutional: Negative for fatigue and fever.  HENT: Negative for ear pain.   Eyes: Negative for pain.  Respiratory: Negative for cough and shortness of breath.   Cardiovascular: Negative for chest pain, palpitations and leg swelling.    Gastrointestinal: Negative for abdominal pain.  Genitourinary: Negative for dysuria.  Musculoskeletal: Negative for arthralgias.  Neurological: Negative for syncope, light-headedness and headaches.  Psychiatric/Behavioral: Negative for dysphoric mood.       Objective:   Physical Exam Constitutional:      Appearance: He is well-developed.  HENT:     Head: Normocephalic.     Right Ear: Hearing normal.     Left Ear: Hearing normal.     Nose: Nose normal.  Neck:     Thyroid: No thyroid mass or thyromegaly.     Vascular: No carotid bruit.     Trachea: Trachea normal.  Cardiovascular:     Rate and Rhythm: Normal rate and regular rhythm.     Pulses: Normal pulses.     Heart sounds: Heart sounds not distant. No murmur. No friction rub. No gallop.      Comments: No peripheral edema Pulmonary:     Effort: Pulmonary effort is normal. No respiratory distress.     Breath sounds: Normal breath sounds.  Musculoskeletal:     Right knee: He exhibits decreased range of motion and swelling. He exhibits no effusion, no deformity, no erythema, no bony tenderness and normal meniscus. Tenderness found. Medial joint line tenderness noted. No MCL, no LCL and no patellar tendon tenderness noted.     Left knee: Normal. He exhibits normal range of  motion.     Comments: Pop noted on McMurray's.  Crepitus with flexion  Skin:    General: Skin is warm and dry.     Findings: No rash.  Psychiatric:        Speech: Speech normal.        Behavior: Behavior normal.        Thought Content: Thought content normal.           Assessment & Plan:

## 2018-04-05 NOTE — Assessment & Plan Note (Signed)
Nml in office high on wrist cuff. Get arm cuff to eval and follow at home.  If truly elevated may be due to NSAIDs, increase ETOH and salt over holidays, weight gain.  no red flags.  Start by following if persistently elevated off NSAIDS ( once knee treated) and with improvement in lifestyle consider further work up.

## 2018-05-04 ENCOUNTER — Other Ambulatory Visit: Payer: Self-pay | Admitting: Family Medicine

## 2018-05-06 NOTE — Telephone Encounter (Signed)
MyChart message sent to patient requesting an update.

## 2018-05-06 NOTE — Telephone Encounter (Signed)
Last prescribed on 04/05/2018 by Dr Diona Browner. Last office visit on 04/05/2018

## 2018-05-09 NOTE — Telephone Encounter (Signed)
No refill needed, see my chart encounter.

## 2018-05-23 ENCOUNTER — Ambulatory Visit (INDEPENDENT_AMBULATORY_CARE_PROVIDER_SITE_OTHER): Payer: BLUE CROSS/BLUE SHIELD | Admitting: Primary Care

## 2018-05-23 ENCOUNTER — Encounter: Payer: Self-pay | Admitting: Primary Care

## 2018-05-23 VITALS — BP 130/86 | HR 82 | Temp 97.8°F | Ht 72.0 in | Wt 214.2 lb

## 2018-05-23 DIAGNOSIS — R030 Elevated blood-pressure reading, without diagnosis of hypertension: Secondary | ICD-10-CM

## 2018-05-23 DIAGNOSIS — R7303 Prediabetes: Secondary | ICD-10-CM

## 2018-05-23 DIAGNOSIS — M25561 Pain in right knee: Secondary | ICD-10-CM

## 2018-05-23 DIAGNOSIS — E785 Hyperlipidemia, unspecified: Secondary | ICD-10-CM

## 2018-05-23 DIAGNOSIS — Z0000 Encounter for general adult medical examination without abnormal findings: Secondary | ICD-10-CM | POA: Diagnosis not present

## 2018-05-23 LAB — COMPREHENSIVE METABOLIC PANEL
ALT: 26 U/L (ref 0–53)
AST: 18 U/L (ref 0–37)
Albumin: 4.5 g/dL (ref 3.5–5.2)
Alkaline Phosphatase: 55 U/L (ref 39–117)
BUN: 11 mg/dL (ref 6–23)
CHLORIDE: 102 meq/L (ref 96–112)
CO2: 28 meq/L (ref 19–32)
Calcium: 9.6 mg/dL (ref 8.4–10.5)
Creatinine, Ser: 0.91 mg/dL (ref 0.40–1.50)
GFR: 90.45 mL/min (ref >60.00)
Glucose, Bld: 104 mg/dL — ABNORMAL HIGH (ref 70–99)
Potassium: 4.5 mEq/L (ref 3.5–5.1)
Sodium: 139 mEq/L (ref 135–145)
Total Bilirubin: 0.5 mg/dL (ref 0.2–1.2)
Total Protein: 7.4 g/dL (ref 6.0–8.3)

## 2018-05-23 LAB — LIPID PANEL
Cholesterol: 168 mg/dL (ref 0–200)
HDL: 50.3 mg/dL (ref >39.00)
LDL CALC: 98 mg/dL (ref 0–99)
NonHDL: 118.13
TRIGLYCERIDES: 103 mg/dL (ref 0.0–149.0)
Total CHOL/HDL Ratio: 3
VLDL: 20.6 mg/dL (ref 0.0–40.0)

## 2018-05-23 LAB — HEMOGLOBIN A1C: Hgb A1c MFr Bld: 5.6 % (ref 4.6–6.5)

## 2018-05-23 NOTE — Progress Notes (Signed)
Subjective:    Patient ID: Arthur Stevens, male    DOB: 05/21/1975, 43 y.o.   MRN: [id number]  HPI  Mr. Lehnen is a 43 year old male who presents today for complete physical.   Immunizations: -Tetanus: Completed in 2015 -Influenza: Completed this season    Diet: He endorses a fair diet. Breakfast: Fruit, granola bar Lunch: Fast food (makes healthier choices) Dinner: Scientist, research (physical sciences), vegetable, starch Snacks: Candy, popcorn Desserts: 5-7 days weekly Beverages: Water, diet soda, alcohol three times weekly  Exercise: He is walking 8000-10,000  Eye exam: Completed in 2019 Dental exam: Completes semi-annually   BP Readings from Last 3 Encounters:  05/23/18 130/86  04/05/18 120/84  11/12/17 130/86    Wt Readings from Last 3 Encounters:  05/23/18 214 lb 4 oz (97.2 kg)  04/05/18 221 lb 4 oz (100.4 kg)  11/12/17 219 lb (99.3 kg)     Review of Systems  Constitutional: Negative for unexpected weight change.  HENT: Negative for rhinorrhea.   Eyes: Negative for visual disturbance.  Respiratory: Negative for cough and shortness of breath.   Cardiovascular: Negative for chest pain.  Gastrointestinal: Negative for constipation and diarrhea.  Genitourinary: Negative for difficulty urinating.  Musculoskeletal: Negative for arthralgias and myalgias.  Skin: Negative for rash.  Allergic/Immunologic: Negative for environmental allergies.  Neurological: Negative for dizziness, numbness and headaches.  Psychiatric/Behavioral: The patient is not nervous/anxious.        Past Medical History:  Diagnosis Date  . Allergy   . Hyperlipidemia      Social History   Socioeconomic History  . Marital status: Married    Spouse name: Not on file  . Number of children: Not on file  . Years of education: Not on file  . Highest education level: Not on file  Occupational History  . Not on file  Social Needs  . Financial resource strain: Not on file  . Food insecurity:    Worry: Not on file   Inability: Not on file  . Transportation needs:    Medical: Not on file    Non-medical: Not on file  Tobacco Use  . Smoking status: Never Smoker  . Smokeless tobacco: Never Used  Substance and Sexual Activity  . Alcohol use: Yes    Comment: 2 x beers per day  . Drug use: No  . Sexual activity: Yes    Birth control/protection: None  Lifestyle  . Physical activity:    Days per week: Not on file    Minutes per session: Not on file  . Stress: Not on file  Relationships  . Social connections:    Talks on phone: Not on file    Gets together: Not on file    Attends religious service: Not on file    Active member of club or organization: Not on file    Attends meetings of clubs or organizations: Not on file    Relationship status: Not on file  . Intimate partner violence:    Fear of current or ex partner: Not on file    Emotionally abused: Not on file    Physically abused: Not on file    Forced sexual activity: Not on file  Other Topics Concern  . Not on file  Social History Narrative   Married.   2 children.   Works as a Financial risk analyst.   Enjoys spending time with children, playing golf    No past surgical history on file.  Family History  Problem Relation Age  of Onset  . Hyperlipidemia Mother   . Hypertension Mother   . Cancer Sister     No Known Allergies  Current Outpatient Medications on File Prior to Visit  Medication Sig Dispense Refill  . Ascorbic Acid (VITAMIN C) 100 MG tablet Take 100 mg by mouth daily.    Marland Kitchen atorvastatin (LIPITOR) 20 MG tablet TAKE 1 TABLET (20 MG TOTAL) BY MOUTH EVERY EVENING. 90 tablet 3  . cetirizine (ZYRTEC) 10 MG tablet Take 10 mg by mouth daily.    . diphenhydramine-acetaminophen (TYLENOL PM) 25-500 MG TABS Take 1 tablet by mouth at bedtime as needed.    . Multiple Vitamin (MULTIVITAMIN) tablet Take 1 tablet by mouth daily.    Marland Kitchen zinc gluconate 50 MG tablet Take 50 mg by mouth daily.     No current facility-administered medications  on file prior to visit.     BP 130/86   Pulse 82   Temp 97.8 F (36.6 C) (Oral)   Ht 6' (1.829 m)   Wt 214 lb 4 oz (97.2 kg)   SpO2 97%   BMI 29.06 kg/m    Objective:   Physical Exam  Constitutional: He is oriented to person, place, and time. He appears well-nourished.  HENT:  Mouth/Throat: No oropharyngeal exudate.  Eyes: Pupils are equal, round, and reactive to light. EOM are normal.  Neck: Neck supple. No thyromegaly present.  Cardiovascular: Normal rate and regular rhythm.  Respiratory: Effort normal and breath sounds normal.  GI: Soft. Bowel sounds are normal. There is no abdominal tenderness.  Musculoskeletal: Normal range of motion.  Neurological: He is alert and oriented to person, place, and time.  Skin: Skin is warm and dry.  Psychiatric: He has a normal mood and affect.           Assessment & Plan:

## 2018-05-23 NOTE — Assessment & Plan Note (Signed)
Compliant to atorvastatin, continue same.  °Repeat lipid panel pending. °

## 2018-05-23 NOTE — Assessment & Plan Note (Signed)
Stable in the office today, continue to monitor.  

## 2018-05-23 NOTE — Patient Instructions (Signed)
Stop by the lab prior to leaving today. I will notify you of your results once received.   Continue exercising. You should be getting 150 minutes of moderate intensity exercise weekly.  Make sure to eat a healthy diet with plenty of vegetables, fruit, whole grains, lean protein.   Ensure you are consuming 64 ounces of water daily.  We will see you in one year for your annual exam or sooner if needed.  It was a pleasure to see you today!    Preventive Care 40-64 Years, Male Preventive care refers to lifestyle choices and visits with your health care provider that can promote health and wellness. What does preventive care include?   A yearly physical exam. This is also called an annual well check.  Dental exams once or twice a year.  Routine eye exams. Ask your health care provider how often you should have your eyes checked.  Personal lifestyle choices, including: ? Daily care of your teeth and gums. ? Regular physical activity. ? Eating a healthy diet. ? Avoiding tobacco and drug use. ? Limiting alcohol use. ? Practicing safe sex. ? Taking low-dose aspirin every day starting at age 59. What happens during an annual well check? The services and screenings done by your health care provider during your annual well check will depend on your age, overall health, lifestyle risk factors, and family history of disease. Counseling Your health care provider may ask you questions about your:  Alcohol use.  Tobacco use.  Drug use.  Emotional well-being.  Home and relationship well-being.  Sexual activity.  Eating habits.  Work and work Statistician. Screening You may have the following tests or measurements:  Height, weight, and BMI.  Blood pressure.  Lipid and cholesterol levels. These may be checked every 5 years, or more frequently if you are over 60 years old.  Skin check.  Lung cancer screening. You may have this screening every year starting at age 46 if you  have a 30-pack-year history of smoking and currently smoke or have quit within the past 15 years.  Colorectal cancer screening. All adults should have this screening starting at age 64 and continuing until age 34. Your health care provider may recommend screening at age 47. You will have tests every 1-10 years, depending on your results and the type of screening test. People at increased risk should start screening at an earlier age. Screening tests may include: ? Guaiac-based fecal occult blood testing. ? Fecal immunochemical test (FIT). ? Stool DNA test. ? Virtual colonoscopy. ? Sigmoidoscopy. During this test, a flexible tube with a tiny camera (sigmoidoscope) is used to examine your rectum and lower colon. The sigmoidoscope is inserted through your anus into your rectum and lower colon. ? Colonoscopy. During this test, a long, thin, flexible tube with a tiny camera (colonoscope) is used to examine your entire colon and rectum.  Prostate cancer screening. Recommendations will vary depending on your family history and other risks.  Hepatitis C blood test.  Hepatitis B blood test.  Sexually transmitted disease (STD) testing.  Diabetes screening. This is done by checking your blood sugar (glucose) after you have not eaten for a while (fasting). You may have this done every 1-3 years. Discuss your test results, treatment options, and if necessary, the need for more tests with your health care provider. Vaccines Your health care provider may recommend certain vaccines, such as:  Influenza vaccine. This is recommended every year.  Tetanus, diphtheria, and acellular pertussis (Tdap, Td) vaccine.  You may need a Td booster every 10 years.  Varicella vaccine. You may need this if you have not been vaccinated.  Zoster vaccine. You may need this after age 70.  Measles, mumps, and rubella (MMR) vaccine. You may need at least one dose of MMR if you were born in 1957 or later. You may also need a  second dose.  Pneumococcal 13-valent conjugate (PCV13) vaccine. You may need this if you have certain conditions and have not been vaccinated.  Pneumococcal polysaccharide (PPSV23) vaccine. You may need one or two doses if you smoke cigarettes or if you have certain conditions.  Meningococcal vaccine. You may need this if you have certain conditions.  Hepatitis A vaccine. You may need this if you have certain conditions or if you travel or work in places where you may be exposed to hepatitis A.  Hepatitis B vaccine. You may need this if you have certain conditions or if you travel or work in places where you may be exposed to hepatitis B.  Haemophilus influenzae type b (Hib) vaccine. You may need this if you have certain risk factors. Talk to your health care provider about which screenings and vaccines you need and how often you need them. This information is not intended to replace advice given to you by your health care provider. Make sure you discuss any questions you have with your health care provider. Document Released: 04/19/2015 Document Revised: 05/13/2017 Document Reviewed: 01/22/2015 Elsevier Interactive Patient Education  2019 [employer].

## 2018-05-23 NOTE — Assessment & Plan Note (Signed)
Continues, mild improvement. He will schedule an appointment with sports medicine.

## 2018-05-23 NOTE — Assessment & Plan Note (Signed)
Immunizations UTD. Recommended to increase exercise, work on diet. Exam unremarkable. Labs pending. Follow up in 1 year for CPE.

## 2018-05-23 NOTE — Assessment & Plan Note (Signed)
Repeat A1C pending, commended him on weight loss, encouraged to increase exercise.

## 2018-06-06 ENCOUNTER — Other Ambulatory Visit: Payer: Self-pay | Admitting: Primary Care

## 2018-06-06 DIAGNOSIS — E785 Hyperlipidemia, unspecified: Secondary | ICD-10-CM

## 2018-08-25 DIAGNOSIS — L821 Other seborrheic keratosis: Secondary | ICD-10-CM | POA: Diagnosis not present

## 2018-08-25 DIAGNOSIS — B958 Unspecified staphylococcus as the cause of diseases classified elsewhere: Secondary | ICD-10-CM | POA: Diagnosis not present

## 2018-08-25 DIAGNOSIS — D2230 Melanocytic nevi of unspecified part of face: Secondary | ICD-10-CM | POA: Diagnosis not present

## 2018-08-25 DIAGNOSIS — L739 Follicular disorder, unspecified: Secondary | ICD-10-CM | POA: Diagnosis not present

## 2018-08-25 DIAGNOSIS — L814 Other melanin hyperpigmentation: Secondary | ICD-10-CM | POA: Diagnosis not present

## 2018-08-25 DIAGNOSIS — D225 Melanocytic nevi of trunk: Secondary | ICD-10-CM | POA: Diagnosis not present

## 2018-12-14 ENCOUNTER — Emergency Department: Payer: BC Managed Care – PPO

## 2018-12-14 ENCOUNTER — Other Ambulatory Visit: Payer: Self-pay

## 2018-12-14 ENCOUNTER — Emergency Department
Admission: EM | Admit: 2018-12-14 | Discharge: 2018-12-14 | Disposition: A | Discharge status: Home / Self Care | Payer: BC Managed Care – PPO | Attending: Emergency Medicine | Admitting: Emergency Medicine

## 2018-12-14 ENCOUNTER — Telehealth: Payer: Self-pay

## 2018-12-14 DIAGNOSIS — R0789 Other chest pain: Secondary | ICD-10-CM | POA: Diagnosis not present

## 2018-12-14 DIAGNOSIS — F419 Anxiety disorder, unspecified: Secondary | ICD-10-CM | POA: Diagnosis not present

## 2018-12-14 DIAGNOSIS — R1111 Vomiting without nausea: Secondary | ICD-10-CM | POA: Diagnosis not present

## 2018-12-14 DIAGNOSIS — R079 Chest pain, unspecified: Secondary | ICD-10-CM | POA: Diagnosis not present

## 2018-12-14 DIAGNOSIS — Z79899 Other long term (current) drug therapy: Secondary | ICD-10-CM | POA: Diagnosis not present

## 2018-12-14 DIAGNOSIS — R9431 Abnormal electrocardiogram [ECG] [EKG]: Secondary | ICD-10-CM | POA: Diagnosis not present

## 2018-12-14 DIAGNOSIS — I10 Essential (primary) hypertension: Secondary | ICD-10-CM | POA: Diagnosis not present

## 2018-12-14 DIAGNOSIS — R002 Palpitations: Secondary | ICD-10-CM | POA: Diagnosis not present

## 2018-12-14 LAB — BASIC METABOLIC PANEL
Anion gap: 12 (ref 5–15)
BUN: 11 mg/dL (ref 6–20)
CO2: 26 mmol/L (ref 22–32)
Calcium: 10 mg/dL (ref 8.9–10.3)
Chloride: 101 mmol/L (ref 98–111)
Creatinine, Ser: 0.83 mg/dL (ref 0.61–1.24)
GFR calc Af Amer: >60 mL/min (ref >60)
GFR calc non Af Amer: >60 mL/min (ref >60)
Glucose, Bld: 130 mg/dL — ABNORMAL HIGH (ref 70–99)
Potassium: 3.9 mmol/L (ref 3.5–5.1)
Sodium: 139 mmol/L (ref 135–145)

## 2018-12-14 LAB — CBC
HCT: 47.5 % (ref 39.0–52.0)
Hemoglobin: 15.7 g/dL (ref 13.0–17.0)
MCH: 29.2 pg (ref 26.0–34.0)
MCHC: 33.1 g/dL (ref 30.0–36.0)
MCV: 88.5 fL (ref 80.0–100.0)
Platelets: 216 10*3/uL (ref 150–400)
RBC: 5.37 MIL/uL (ref 4.22–5.81)
RDW: 12.8 % (ref 11.5–15.5)
WBC: 5.5 10*3/uL (ref 4.0–10.5)
nRBC: 0 % (ref 0.0–0.2)

## 2018-12-14 LAB — TROPONIN I (HIGH SENSITIVITY)
Troponin I (High Sensitivity): 6 ng/L (ref <18)
Troponin I (High Sensitivity): 6 ng/L (ref <18)

## 2018-12-14 MED ORDER — SODIUM CHLORIDE 0.9% FLUSH: 3.0000 mL | Freq: Once | INTRAVENOUS | Status: DC

## 2018-12-14 NOTE — ED Notes (Signed)
Pt sitting at bed speaking with this RN in NAD, A&Ox4. Reports the feeling he had in his chest was described as "Warmth" but pain has subsided. Wife at bedside

## 2018-12-14 NOTE — Telephone Encounter (Signed)
Noted  

## 2018-12-14 NOTE — ED Triage Notes (Signed)
Pt comes into the ED via EMS from urgent care with c/o palpitations with N/V, some anxiety and HTN  For the past couple days. Pt is in NAD at present.

## 2018-12-14 NOTE — Telephone Encounter (Signed)
Pt said since 03/2018 pt having fluctuating BP. Last 2 wks pt has had more elevation in BP; last wk ran mostly in 130/90. On 12/13/18 BP 174/110; pt vomited x 1;(pt said very unusual for him to vomit). pt having a lot of stress at work.  Now BP 151/105 P84. No CP, tightness or pressure, no neck,jaw or arm pain,no SOB, no H/A or dizziness and no fever T 98.6. pt is not on BP med. At times when BP is elevated pt has warm feeling inside on lt side of chest as well as slight sweating on the outside of skin but does not feel clammy but like pt is warm. No covid symptoms, no travel but on 12/02/18 was around a coworker who tested positive for covid on 12/12/18. Both were wearing a mask when pt saw this coworker on 12/02/18.No available appt at [facility] today; Pt does not want to go to ED but will go to [facility] in [place]. FYI to Glenda Chroman FNP.

## 2018-12-14 NOTE — ED Provider Notes (Signed)
[facility] Emergency Department Provider Note   ____________________________________________   First MD Initiated Contact with Patient 12/14/18 1504     (approximate)  I have reviewed the triage vital signs and the nursing notes.   HISTORY  Chief Complaint Palpitations    HPI Arthur Stevens is a 43 y.o. male with past medical history of hyperlipidemia who presents to the ED complaining of elevated blood pressure and chest discomfort.  Patient reports for the past couple of days he has been feeling like his heart is racing at times and he has been having significant difficulty sleeping due to being in "cold sweats".  He reports being nauseous at times and vomited once yesterday.  He is also been checking his blood pressure regularly and states it has been consistently elevated, does not currently take blood pressure medicine or have a diagnosis of hypertension.  He denies any fevers, cough, or shortness of breath.  He was initially evaluated in urgent care, was subsequently sent to the ED due to abnormal EKG.        Past Medical History:  Diagnosis Date  . Allergy   . Hyperlipidemia     Patient Active Problem List   Diagnosis Date Noted  . Elevated blood pressure reading without diagnosis of hypertension 04/05/2018  . Acute pain of right knee 04/05/2018  . Prediabetes 05/24/2017  . Hyperlipidemia 04/18/2015  . Preventative health care 04/18/2015    History reviewed. No pertinent surgical history.  Prior to Admission medications   Medication Sig Start Date End Date Taking? Authorizing Provider  Ascorbic Acid (VITAMIN C) 100 MG tablet Take 100 mg by mouth daily.    [provider]  atorvastatin (LIPITOR) 20 MG tablet TAKE 1 TABLET (20 MG TOTAL) BY MOUTH EVERY EVENING. 06/06/18   Pleas Koch, NP  cetirizine (ZYRTEC) 10 MG tablet Take 10 mg by mouth daily.    [provider]  diphenhydramine-acetaminophen (TYLENOL PM) 25-500 MG  TABS Take 1 tablet by mouth at bedtime as needed.    [provider]  Multiple Vitamin (MULTIVITAMIN) tablet Take 1 tablet by mouth daily.    [provider]  zinc gluconate 50 MG tablet Take 50 mg by mouth daily.    [provider]    Allergies Patient has no known allergies.  Family History  Problem Relation Age of Onset  . Hyperlipidemia Mother   . Hypertension Mother   . Cancer Sister     Social History Social History   Tobacco Use  . Smoking status: Never Smoker  . Smokeless tobacco: Never Used  Substance Use Topics  . Alcohol use: Yes    Comment: 2 x beers per day  . Drug use: No    Review of Systems  Constitutional: No fever/chills Eyes: No visual changes. ENT: No sore throat. Cardiovascular: Positive for chest "warmth". Respiratory: Denies shortness of breath. Gastrointestinal: No abdominal pain.  No nausea, no vomiting.  No diarrhea.  No constipation. Genitourinary: Negative for dysuria. Musculoskeletal: Negative for back pain. Skin: Negative for rash. Neurological: Negative for headaches, focal weakness or numbness.  ____________________________________________   PHYSICAL EXAM:  VITAL SIGNS: ED Triage Vitals  Enc Vitals Group     BP 12/14/18 1233 (!) 159/101     Pulse Rate 12/14/18 1233 87     Resp 12/14/18 1233 19     Temp 12/14/18 1233 98.3 F (36.8 C)     Temp Source 12/14/18 1233 Oral     SpO2  12/14/18 1233 99 %     Weight 12/14/18 1234 219 lb (99.3 kg)     Height 12/14/18 1234 6' (1.829 m)     Head Circumference --      Peak Flow --      Pain Score 12/14/18 1237 0     Pain Loc --      Pain Edu? --      Excl. in [place]? --     Constitutional: Alert and oriented.  Anxious appearing. Eyes: Conjunctivae are normal. Head: Atraumatic. Nose: No congestion/rhinnorhea. Mouth/Throat: Mucous membranes are moist. Neck: Normal ROM Cardiovascular: Normal rate, regular rhythm. Grossly normal heart sounds. Respiratory:  Normal respiratory effort.  No retractions. Lungs CTAB. Gastrointestinal: Soft and nontender. No distention. Genitourinary: deferred Musculoskeletal: No lower extremity tenderness nor edema. Neurologic:  Normal speech and language. No gross focal neurologic deficits are appreciated. Skin:  Skin is warm, dry and intact. No rash noted. Psychiatric: Mood and affect are normal. Speech and behavior are normal.  ____________________________________________   LABS (all labs ordered are listed, but only abnormal results are displayed)  Labs Reviewed  BASIC METABOLIC PANEL - Abnormal; Notable for the following components:      Result Value   Glucose, Bld 130 (*)    All other components within normal limits  CBC  TROPONIN I (HIGH SENSITIVITY)  TROPONIN I (HIGH SENSITIVITY)   ____________________________________________  EKG  ED ECG REPORT I, Blake Divine, the attending physician, personally viewed and interpreted this ECG.   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  He was noted to have abnormal EKG at urgent care and referred to the ED.  He does have lateral T wave inversions, but given his symptomatology, very low suspicion that this represents ACS.  2 sets troponin negative and chest pain is resolved at this time.  Doubt PE as he is PERC negative.  Chest x-ray negative for acute process and remainder of labs unremarkable.  Anxiety appears to be a significant component of his symptoms  as he admits he is under a significant amount of stress at work.  Given no hypertensive emergency, will hold off on acute management of his blood pressure at this time.  He would benefit from follow-up with his PCP for recheck of blood pressure when he is less anxious.  Counseled to follow-up with PCP and return to the ED for new or worsening symptoms, patient agrees with plan.      ____________________________________________   FINAL CLINICAL IMPRESSION(S) / ED DIAGNOSES  Final diagnoses:  Burning chest pain  Anxiety     ED Discharge Orders    None       Note:  This document was prepared using Dragon voice recognition software and may include unintentional dictation errors.   Blake Divine, MD 12/14/18 [phone]

## 2018-12-14 NOTE — ED Notes (Signed)
Pt to ER via EMS from UC with c/o HTN, anxiety and chest discomfort.  #20 ga IV L AC via EMS and reports of Twave inversion.

## 2018-12-14 NOTE — ED Notes (Signed)
E-signature pad unavailable at this time, discharge instructions given and paperwork explained, pt voices understanding of teaching 

## 2018-12-19 ENCOUNTER — Other Ambulatory Visit: Payer: Self-pay

## 2018-12-19 ENCOUNTER — Ambulatory Visit: Payer: BC Managed Care – PPO | Admitting: Primary Care

## 2018-12-19 ENCOUNTER — Encounter: Payer: Self-pay | Admitting: Primary Care

## 2018-12-19 VITALS — BP 138/98 | HR 93 | Temp 98.0°F | Ht 72.0 in | Wt 220.5 lb

## 2018-12-19 DIAGNOSIS — Z23 Encounter for immunization: Secondary | ICD-10-CM | POA: Diagnosis not present

## 2018-12-19 DIAGNOSIS — R030 Elevated blood-pressure reading, without diagnosis of hypertension: Secondary | ICD-10-CM | POA: Diagnosis not present

## 2018-12-19 NOTE — Addendum Note (Signed)
Addended by: Jacqualin Combes on: 12/19/2018 08:14 AM   Modules accepted: Orders

## 2018-12-19 NOTE — Progress Notes (Signed)
Subjective:    Patient ID: Arthur Stevens, male    DOB: 08/31/1975, 43 y.o.   MRN: [id number]  HPI  Mr. Vanboxtel is a 43 year old male with a history of hyperlipidemia, prediabetes, elevated blood pressure readings who presents today for ED follow up.  He presented to [facility] ED on 12/14/2018 with a chief complaint of palpitations. He endorsed a several day history of palpitations with chest discomfort, difficulty sleeping due to "sweats", nausea with one episode of vomiting, elevated blood pressure readings. He was initially evaluated at [facility] who sent him to the ED given symptoms and "abnormal ECG".  During his stay in the ED his ECG showed inverted t waves but wasn't suspect to be secondary to ACS. Two sets of Troponin levels were negative, chest xray negative. Symptoms were suspected to be secondary to anxiety so it was recommended he return to PCP for follow up of BP when less anxious.   Since his ED visit he's been checking his BP three times daily which is ranging 120's-150's/80's-90's. He has a family history of hypertension in his mother. He's not had any palpitations but has been out of work. He notes today that he's noticed readings of 140's-150's/90's intermittently over the last several months, this is during episodes of anxiety. He has been very anxious/stressed with work, he will be returning to work today.   He denies chest pain, dizziness, headaches.   BP Readings from Last 3 Encounters:  12/19/18 (!) 138/98  12/14/18 (!) 152/103  05/23/18 130/86     Review of Systems  Eyes: Negative for visual disturbance.  Respiratory: Negative for shortness of breath.   Cardiovascular: Negative for chest pain.  Neurological: Negative for dizziness and headaches.  Psychiatric/Behavioral: The patient is nervous/anxious.        Past Medical History:  Diagnosis Date  . Allergy   . Hyperlipidemia      Social History   Socioeconomic History  . Marital status: Married   Spouse name: Not on file  . Number of children: Not on file  . Years of education: Not on file  . Highest education level: Not on file  Occupational History  . Not on file  Social Needs  . Financial resource strain: Not on file  . Food insecurity    Worry: Not on file    Inability: Not on file  . Transportation needs    Medical: Not on file    Non-medical: Not on file  Tobacco Use  . Smoking status: Never Smoker  . Smokeless tobacco: Never Used  Substance and Sexual Activity  . Alcohol use: Yes    Comment: 2 x beers per day  . Drug use: No  . Sexual activity: Yes    Birth control/protection: None  Lifestyle  . Physical activity    Days per week: Not on file    Minutes per session: Not on file  . Stress: Not on file  Relationships  . Social Herbalist on phone: Not on file    Gets together: Not on file    Attends religious service: Not on file    Active member of club or organization: Not on file    Attends meetings of clubs or organizations: Not on file    Relationship status: Not on file  . Intimate partner violence    Fear of current or ex partner: Not on file    Emotionally abused: Not on file    Physically  abused: Not on file    Forced sexual activity: Not on file  Other Topics Concern  . Not on file  Social History Narrative   Married.   2 children.   Works as a Financial risk analyst.   Enjoys spending time with children, playing golf    No past surgical history on file.  Family History  Problem Relation Age of Onset  . Hyperlipidemia Mother   . Hypertension Mother   . Cancer Sister     No Known Allergies  Current Outpatient Medications on File Prior to Visit  Medication Sig Dispense Refill  . Ascorbic Acid (VITAMIN C) 100 MG tablet Take 100 mg by mouth daily.    Marland Kitchen atorvastatin (LIPITOR) 20 MG tablet TAKE 1 TABLET (20 MG TOTAL) BY MOUTH EVERY EVENING. 90 tablet 3  . cetirizine (ZYRTEC) 10 MG tablet Take 10 mg by mouth daily.    .  diphenhydramine-acetaminophen (TYLENOL PM) 25-500 MG TABS Take 1 tablet by mouth at bedtime as needed.    . Multiple Vitamin (MULTIVITAMIN) tablet Take 1 tablet by mouth daily.    Marland Kitchen zinc gluconate 50 MG tablet Take 50 mg by mouth daily.     No current facility-administered medications on file prior to visit.     BP (!) 138/98   Pulse 93   Temp 98 F (36.7 C) (Temporal)   Ht 6' (1.829 m)   Wt 220 lb 8 oz (100 kg)   SpO2 97%   BMI 29.91 kg/m    Objective:   Physical Exam  Constitutional: He appears well-nourished.  Neck: Neck supple.  Cardiovascular: Normal rate and regular rhythm.  Respiratory: Effort normal and breath sounds normal.  Skin: Skin is warm and dry.  Psychiatric: He has a normal mood and affect.           Assessment & Plan:

## 2018-12-19 NOTE — Assessment & Plan Note (Addendum)
Two documented elevated readings, also with elevated home readings. Do suspect some of this to be secondary to anxiety for which he agrees.   Will have him start monitoring his BP once daily over the next two weeks on a more consistent basis. We will plan to see him back in the office in two weeks for BP check and repeat ECG. If BP continues to remain above goal then we will start treatment.   He will call sooner if symptoms return. He agrees. Exam today stable. Hospital notes, labs, imaging reviewed.

## 2018-12-19 NOTE — Patient Instructions (Signed)
Start monitoring your blood pressure daily, around the same time of day, for the next 2 weeks.  Ensure that you have rested for 30 minutes prior to checking your blood pressure. Record your readings and bring them to your next visit.  Please call me sooner if you develop chest pain, palpitations, dizziness, severe headaches.  Work to reduce anxiety as discussed.   Schedule a follow up visit for two weeks for blood pressure check and repeat ECG.  It was a pleasure to see you today!

## 2019-01-02 ENCOUNTER — Ambulatory Visit: Payer: BC Managed Care – PPO | Admitting: Primary Care

## 2019-01-02 ENCOUNTER — Encounter: Payer: Self-pay | Admitting: Primary Care

## 2019-01-02 ENCOUNTER — Other Ambulatory Visit: Payer: Self-pay

## 2019-01-02 VITALS — BP 142/86 | HR 90 | Temp 97.7°F | Ht 72.0 in | Wt 219.8 lb

## 2019-01-02 DIAGNOSIS — F411 Generalized anxiety disorder: Secondary | ICD-10-CM

## 2019-01-02 DIAGNOSIS — I10 Essential (primary) hypertension: Secondary | ICD-10-CM

## 2019-01-02 MED ORDER — CITALOPRAM HYDROBROMIDE 10 MG PO TABS: 10.0000 mg | ORAL_TABLET | Freq: Every day | ORAL | 1 refills | Status: DC

## 2019-01-02 NOTE — Patient Instructions (Signed)
Start citalopram 10 mg tablets once daily for anxiety. Take 1 tablet by mouth every morning.  Continue to monitor your blood pressure once daily, record readings.  Schedule a follow up visit with me for 6 weeks for follow up of anxiety and blood pressure.  It was a pleasure to see you today!

## 2019-01-02 NOTE — Assessment & Plan Note (Signed)
Chronic for 6+ months, contributing to symptoms of elevated BP readings, palpitations (with prior ED visit), etc.   We discussed treatment options, he agrees to medication. Rx for citalopram 10 mg sent to pharmacy.  We discussed possible side effects of headache, GI upset, drowsiness, and SI/HI. If thoughts of SI/HI develop, we discussed to present to the emergency immediately. Patient verbalized understanding.   Follow up in 6 weeks for re-evaluation.

## 2019-01-02 NOTE — Assessment & Plan Note (Addendum)
Home readings improved, some borderline high. It does still seem that elevated readings are secondary to anxiety, agree to treat.   Rx for citalopram 10 mg sent to pharmacy for treatment. We will plan to see him back in 6 weeks for follow up.  Repeat ECG today with NSR, rate of 78. No PAC/PVC, ST elevation. Appears similar to ECG from earlier this month. Continue to monitor.

## 2019-01-02 NOTE — Progress Notes (Signed)
Subjective:    Patient ID: Arthur Stevens, male    DOB: [birth year], 43 y.o.   MRN: [id number]  HPI  Mr. Cumba is a 43 year old male with a history of prediabetes, hyperlipidemia, elevated blood pressure reading who presents today for follow up of blood pressure readings.   BP Readings from Last 3 Encounters:  01/02/19 (!) 142/86  12/19/18 (!) 138/98  12/14/18 (!) 152/103   He was last evaluated on 12/19/18 for ED follow up for palpitations. BP was elevated with home readings of 120's-150's/80's-90's. During his ED visit his ECG was noted to show inverted t-waves but wasn't suspected to be from ACS. During his last visit he endorsed a tremendous amount of stress at work, wanted to monitor BP at home because of this. He is here for follow up today.  Since his last visit he's been checking his BP at home which is running 120's-140's/70's-80's, mostly [id number] systolic. Today he's feeling more stressed/anxious given that it's Monday morning. He's been doing exercises about 5 days weekly since his last visit, he's felt better from this.   Chronic anxiety over the last 6+ months. Symptoms include waking during the night with mind racing thoughts, palpitations, dwelling on things. He does feel as though the majority of his stress is secondary to work. GAD 7 score of 11 today. He is interested in treating anxiety as he feels this to be the main cause for symptoms.   Review of Systems  Eyes: Negative for visual disturbance.  Respiratory: Negative for shortness of breath.   Cardiovascular: Negative for chest pain.  Neurological: Negative for dizziness and headaches.       Past Medical History:  Diagnosis Date  . Allergy   . Hyperlipidemia      Social History   Socioeconomic History  . Marital status: Married    Spouse name: Not on file  . Number of children: Not on file  . Years of education: Not on file  . Highest education level: Not on file  Occupational History  . Not on file   Social Needs  . Financial resource strain: Not on file  . Food insecurity    Worry: Not on file    Inability: Not on file  . Transportation needs    Medical: Not on file    Non-medical: Not on file  Tobacco Use  . Smoking status: Never Smoker  . Smokeless tobacco: Never Used  Substance and Sexual Activity  . Alcohol use: Yes    Comment: 2 x beers per day  . Drug use: No  . Sexual activity: Yes    Birth control/protection: None  Lifestyle  . Physical activity    Days per week: Not on file    Minutes per session: Not on file  . Stress: Not on file  Relationships  . Social Herbalist on phone: Not on file    Gets together: Not on file    Attends religious service: Not on file    Active member of club or organization: Not on file    Attends meetings of clubs or organizations: Not on file    Relationship status: Not on file  . Intimate partner violence    Fear of current or ex partner: Not on file    Emotionally abused: Not on file    Physically abused: Not on file    Forced sexual activity: Not on file  Other Topics Concern  . Not on file  Social History Narrative   Married.   2 children.   Works as a Financial risk analyst.   Enjoys spending time with children, playing golf    No past surgical history on file.  Family History  Problem Relation Age of Onset  . Hyperlipidemia Mother   . Hypertension Mother   . Cancer Sister     No Known Allergies  Current Outpatient Medications on File Prior to Visit  Medication Sig Dispense Refill  . Ascorbic Acid (VITAMIN C) 100 MG tablet Take 100 mg by mouth daily.    Marland Kitchen atorvastatin (LIPITOR) 20 MG tablet TAKE 1 TABLET (20 MG TOTAL) BY MOUTH EVERY EVENING. 90 tablet 3  . cetirizine (ZYRTEC) 10 MG tablet Take 10 mg by mouth daily.    . diphenhydramine-acetaminophen (TYLENOL PM) 25-500 MG TABS Take 1 tablet by mouth at bedtime as needed.    . Multiple Vitamin (MULTIVITAMIN) tablet Take 1 tablet by mouth daily.    Marland Kitchen zinc  gluconate 50 MG tablet Take 50 mg by mouth daily.     No current facility-administered medications on file prior to visit.     BP (!) 142/86   Pulse 90   Temp 97.7 F (36.5 C) (Temporal)   Ht 6' (1.829 m)   Wt 219 lb 12 oz (99.7 kg)   SpO2 97%   BMI 29.80 kg/m    Objective:   Physical Exam  Constitutional: He appears well-nourished.  Neck: Neck supple.  Cardiovascular: Normal rate and regular rhythm.  Respiratory: Effort normal and breath sounds normal.  Skin: Skin is warm and dry.           Assessment & Plan:

## 2019-02-09 ENCOUNTER — Ambulatory Visit: Payer: BC Managed Care – PPO | Admitting: Primary Care

## 2019-02-09 ENCOUNTER — Other Ambulatory Visit: Payer: Self-pay

## 2019-02-09 ENCOUNTER — Encounter: Payer: Self-pay | Admitting: Primary Care

## 2019-02-09 VITALS — BP 134/92 | HR 78 | Temp 97.2°F | Ht 72.0 in | Wt 210.2 lb

## 2019-02-09 DIAGNOSIS — I10 Essential (primary) hypertension: Secondary | ICD-10-CM

## 2019-02-09 DIAGNOSIS — F411 Generalized anxiety disorder: Secondary | ICD-10-CM

## 2019-02-09 MED ORDER — CITALOPRAM HYDROBROMIDE 10 MG PO TABS: 10.0000 mg | ORAL_TABLET | Freq: Every day | ORAL | 1 refills | Status: DC

## 2019-02-09 MED ORDER — LISINOPRIL 10 MG PO TABS: 10.0000 mg | ORAL_TABLET | Freq: Every day | ORAL | 0 refills | Status: DC

## 2019-02-09 NOTE — Assessment & Plan Note (Signed)
Improved after initiation of citalopram 10 mg, no side effects. Continue at citalopram 10 mg for now, he will update.

## 2019-02-09 NOTE — Assessment & Plan Note (Signed)
Above goal in the office again, home readings still borderline with some normal readings. Given history of hyperlipidemia, prediabetes we will treat. Rx for lisinopril 10 mg sent to pharmacy.   We will plan to see him back in the office in 2-3 weeks for BP check and BMP.

## 2019-02-09 NOTE — Progress Notes (Signed)
Subjective:    Patient ID: Arthur Stevens, male    DOB: 1975/11/05, 43 y.o.   MRN: [id number]  HPI  Mr. Gallier is a 43 year old male who presents today for follow up.  1) Essential Hypertension: Currently not managed on medication. He was last evaluated 6 weeks ago, blood pressure too high with borderline home readings. We decided to work on his anxiety as he felt this was contributing to his elevated BP readings.   Since his last visit he's checking his BP at home and is getting readings of 130's-140's/70's-90's, mostly 130's/80's-90's. He denies headaches, dizziness, chest pain. He does feel as though his BP is higher during Monday's and Tuesday's as they are more stressful. He has never been treated for hypertension in the past.   BP Readings from Last 3 Encounters:  02/09/19 (!) 134/92  01/02/19 (!) 142/86  12/19/18 (!) 138/98   2) GAD: Currently managed on citalopram 10 mg which was initiated 6 weeks ago for symptoms of mind racing thoughts, palpitations, dwelling. GAD 7 score of 11.   Since his last visit he's doing better. Positive effects include able to handle anxiety, sleeping better and is able to fall asleep easier. He denies nausea, GI upset, SI/HI.   Review of Systems  Eyes: Negative for visual disturbance.  Respiratory: Negative for shortness of breath.   Cardiovascular: Negative for chest pain.  Neurological: Negative for dizziness and headaches.  Psychiatric/Behavioral:       See HPI       Past Medical History:  Diagnosis Date  . Allergy   . Hyperlipidemia      Social History   Socioeconomic History  . Marital status: Married    Spouse name: Not on file  . Number of children: Not on file  . Years of education: Not on file  . Highest education level: Not on file  Occupational History  . Not on file  Social Needs  . Financial resource strain: Not on file  . Food insecurity    Worry: Not on file    Inability: Not on file  . Transportation needs   Medical: Not on file    Non-medical: Not on file  Tobacco Use  . Smoking status: Never Smoker  . Smokeless tobacco: Never Used  Substance and Sexual Activity  . Alcohol use: Yes    Comment: 2 x beers per day  . Drug use: No  . Sexual activity: Yes    Birth control/protection: None  Lifestyle  . Physical activity    Days per week: Not on file    Minutes per session: Not on file  . Stress: Not on file  Relationships  . Social Herbalist on phone: Not on file    Gets together: Not on file    Attends religious service: Not on file    Active member of club or organization: Not on file    Attends meetings of clubs or organizations: Not on file    Relationship status: Not on file  . Intimate partner violence    Fear of current or ex partner: Not on file    Emotionally abused: Not on file    Physically abused: Not on file    Forced sexual activity: Not on file  Other Topics Concern  . Not on file  Social History Narrative   Married.   2 children.   Works as a Financial risk analyst.   Enjoys spending time with children, playing golf  No past surgical history on file.  Family History  Problem Relation Age of Onset  . Hyperlipidemia Mother   . Hypertension Mother   . Cancer Sister     No Known Allergies  Current Outpatient Medications on File Prior to Visit  Medication Sig Dispense Refill  . Ascorbic Acid (VITAMIN C) 100 MG tablet Take 100 mg by mouth daily.    Marland Kitchen atorvastatin (LIPITOR) 20 MG tablet TAKE 1 TABLET (20 MG TOTAL) BY MOUTH EVERY EVENING. 90 tablet 3  . cetirizine (ZYRTEC) 10 MG tablet Take 10 mg by mouth daily.    . citalopram (CELEXA) 10 MG tablet Take 1 tablet (10 mg total) by mouth daily. For anxiety. 30 tablet 1  . diphenhydramine-acetaminophen (TYLENOL PM) 25-500 MG TABS Take 1 tablet by mouth at bedtime as needed.    . Multiple Vitamin (MULTIVITAMIN) tablet Take 1 tablet by mouth daily.    Marland Kitchen zinc gluconate 50 MG tablet Take 50 mg by mouth daily.      No current facility-administered medications on file prior to visit.     BP (!) 134/92   Pulse 78   Temp (!) 97.2 F (36.2 C) (Temporal)   Ht 6' (1.829 m)   Wt 210 lb 4 oz (95.4 kg)   SpO2 98%   BMI 28.52 kg/m    Objective:   Physical Exam  Constitutional: He appears well-nourished.  Neck: Neck supple.  Cardiovascular: Normal rate and regular rhythm.  Respiratory: Effort normal and breath sounds normal.  Skin: Skin is warm and dry.  Psychiatric: He has a normal mood and affect.           Assessment & Plan:

## 2019-02-09 NOTE — Patient Instructions (Signed)
Start lisinopril 10 mg once daily for high blood pressure. Take every morning.  Continue to monitor your blood pressure once daily around the same time of day.  Continue taking citalopram 10 mg for anxiety.  Schedule a follow up visit with me in 2-3 weeks for blood pressure check.  It was a pleasure to see you today!

## 2019-02-28 NOTE — Telephone Encounter (Signed)
Best number [phone]  See my chart message   Can pt come in office or do you want him to r/s

## 2019-03-01 ENCOUNTER — Ambulatory Visit: Payer: BC Managed Care – PPO | Admitting: Primary Care

## 2019-03-06 ENCOUNTER — Other Ambulatory Visit: Payer: Self-pay

## 2019-03-06 DIAGNOSIS — I10 Essential (primary) hypertension: Secondary | ICD-10-CM

## 2019-03-07 MED ORDER — LISINOPRIL 10 MG PO TABS
10.0000 mg | ORAL_TABLET | Freq: Every day | ORAL | 0 refills | Status: DC
Start: 2019-03-07 — End: 2019-04-04

## 2019-04-04 ENCOUNTER — Other Ambulatory Visit: Payer: Self-pay | Admitting: Primary Care

## 2019-04-04 DIAGNOSIS — I10 Essential (primary) hypertension: Secondary | ICD-10-CM

## 2019-04-10 ENCOUNTER — Other Ambulatory Visit: Payer: Self-pay

## 2019-04-10 DIAGNOSIS — I10 Essential (primary) hypertension: Secondary | ICD-10-CM

## 2019-04-10 MED ORDER — LISINOPRIL 10 MG PO TABS: 10.0000 mg | ORAL_TABLET | Freq: Every day | ORAL | 0 refills | Status: DC

## 2019-04-12 MED ORDER — LISINOPRIL 10 MG PO TABS: 10.0000 mg | ORAL_TABLET | Freq: Every day | ORAL | 0 refills | Status: DC

## 2019-04-12 NOTE — Addendum Note (Signed)
Addended by: Jacqualin Combes on: 04/12/2019 12:55 PM   Modules accepted: Orders

## 2019-04-14 IMAGING — CT CT RENAL STONE PROTOCOL
2 of 4 series · 16 of 46 positions shown, 18 images · non-contrast
Comparison: None.

CLINICAL DATA: Right lower quadrant pain radiating into right
flank.

EXAM:
CT ABDOMEN AND PELVIS WITHOUT CONTRAST
TECHNIQUE: Multidetector CT imaging of the abdomen and pelvis was performed
following the standard protocol without IV contrast.

[Series 2: stone full standard · axial · 0.73mm/px · z∈[-1123,-668]mm · 13 of 101 slices shown, 15 images]
[im 5/101  soft-tissue]
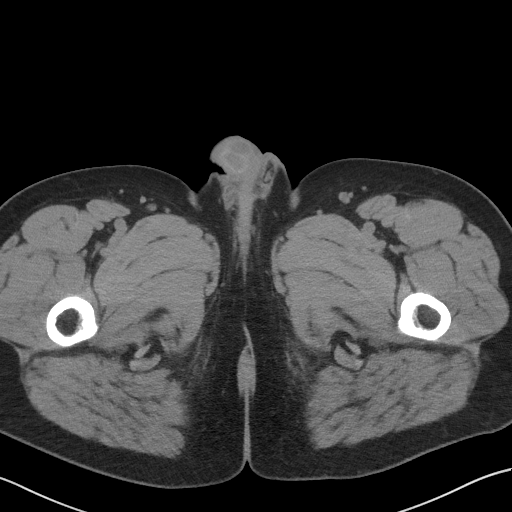
[im 5/101  bone]
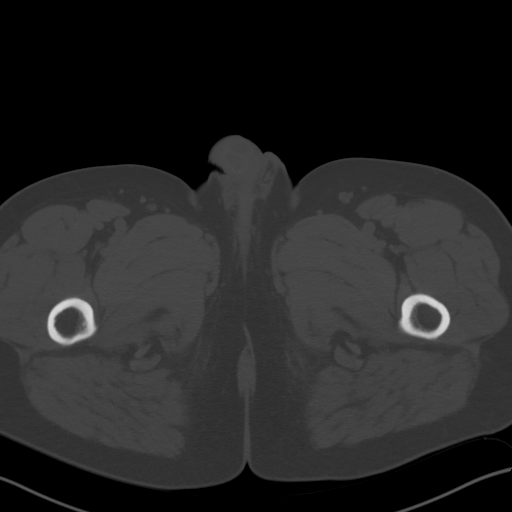
[im 14/101  soft-tissue]
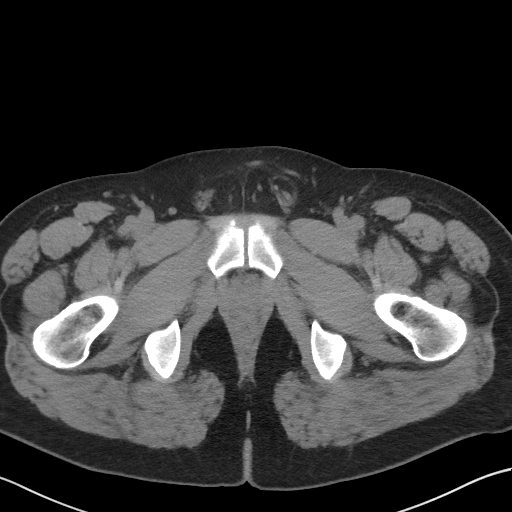
[im 22/101  soft-tissue]
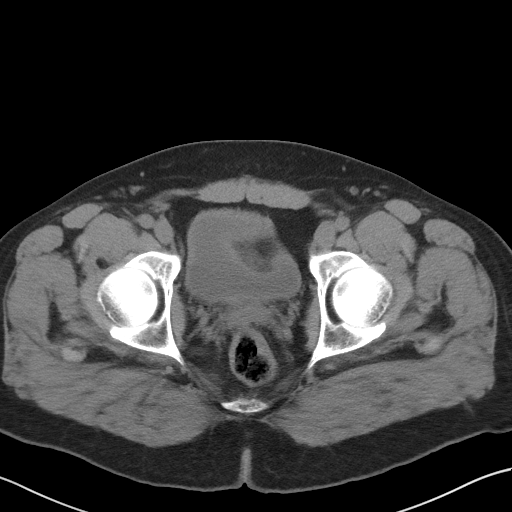
[im 27/101  soft-tissue]
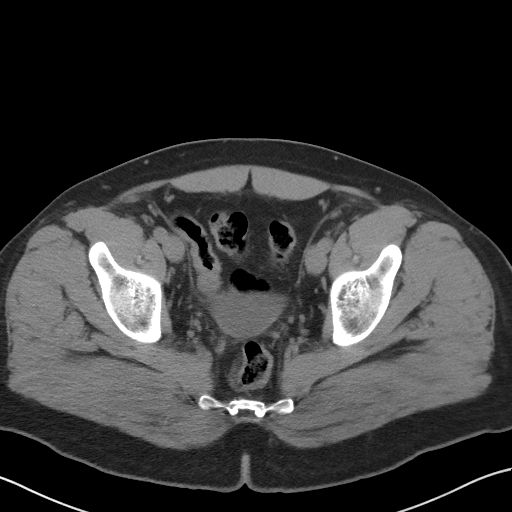
[im 35/101  soft-tissue]
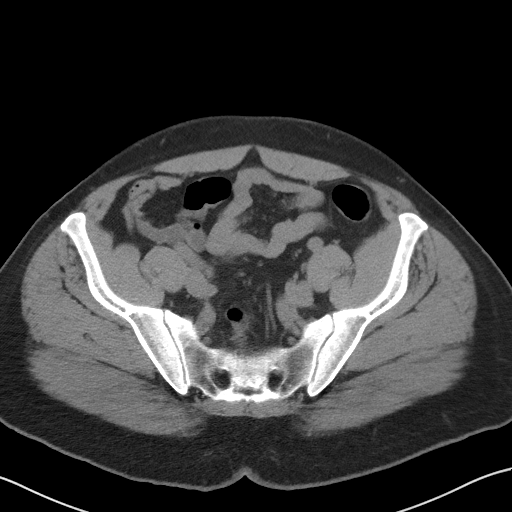
[im 44/101  soft-tissue]
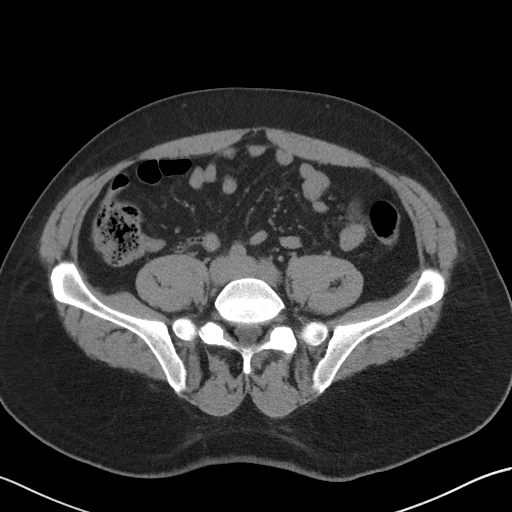
[im 53/101  soft-tissue]
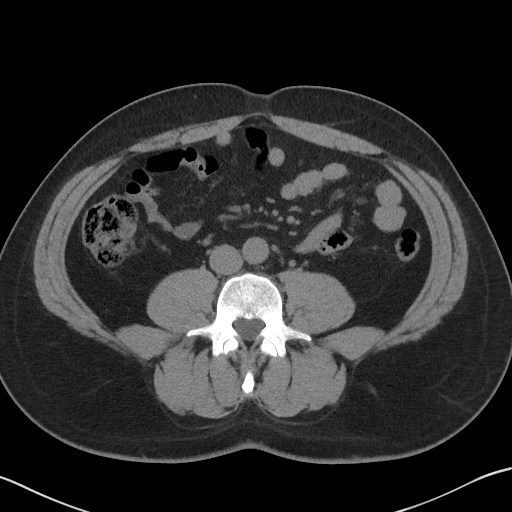
[im 57/101  soft-tissue]
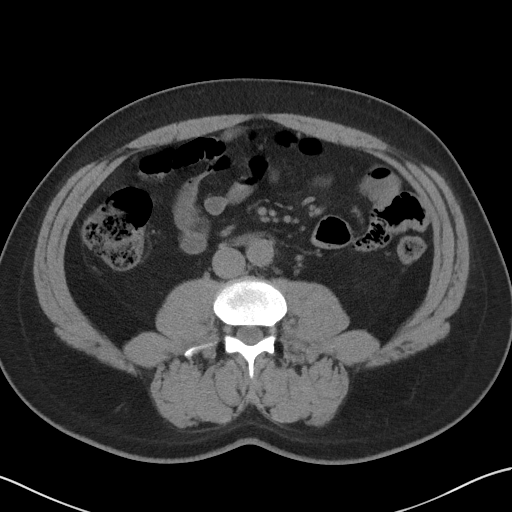
[im 66/101  soft-tissue]
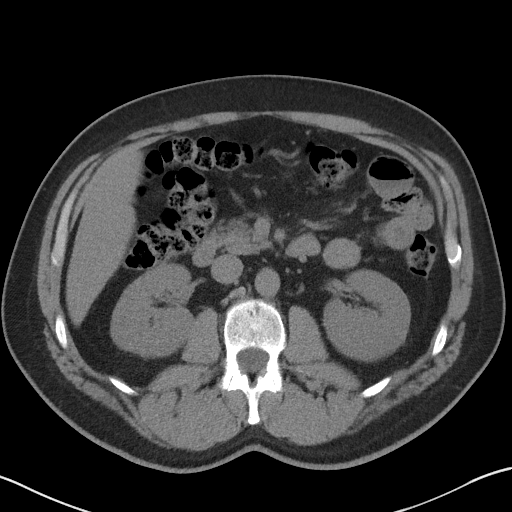
[im 66/101  bone]
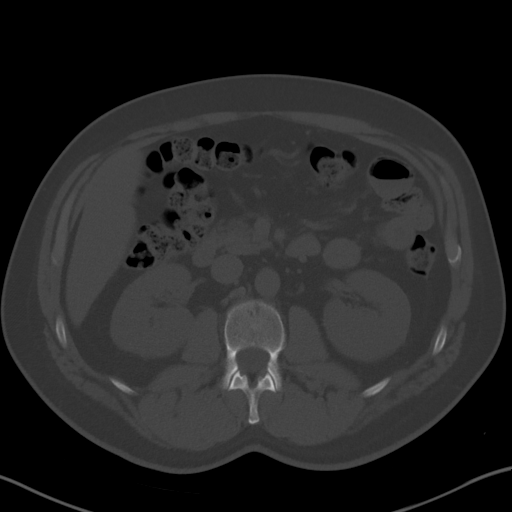
[im 74/101  soft-tissue]
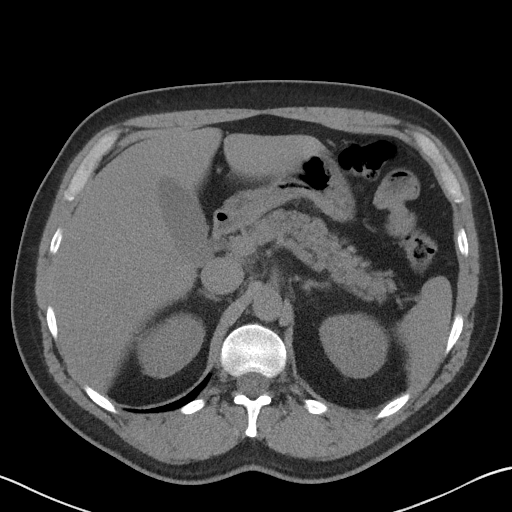
[im 79/101  soft-tissue]
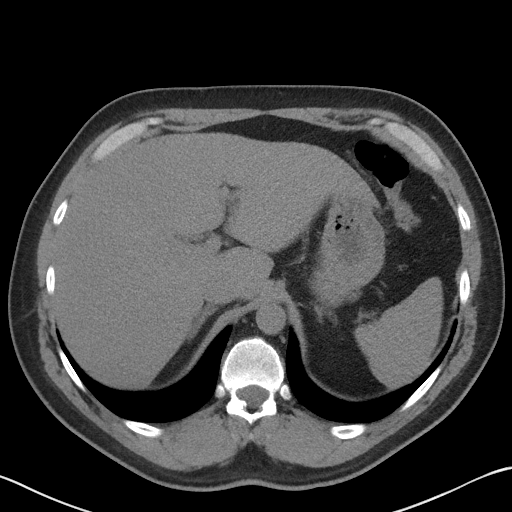
[im 87/101  soft-tissue]
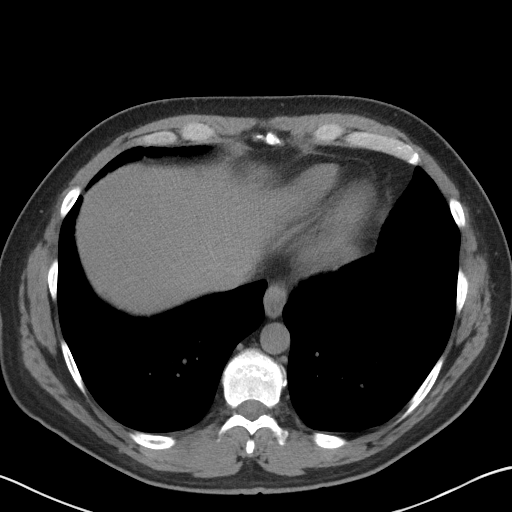
[im 96/101  soft-tissue]
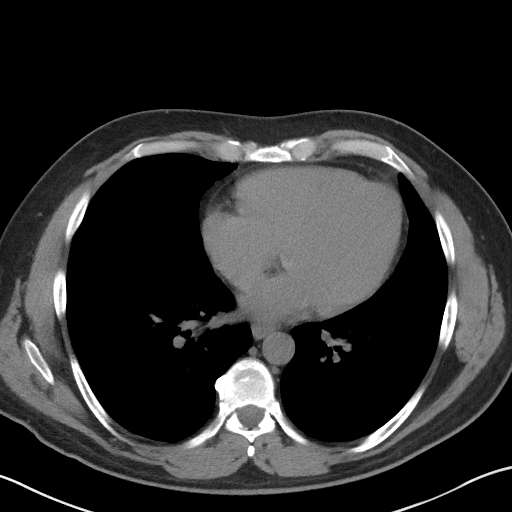

[Series 5: coronal · coronal · 0.68mm/px · 3 of 137 slices shown]
[im 46/137  soft-tissue]
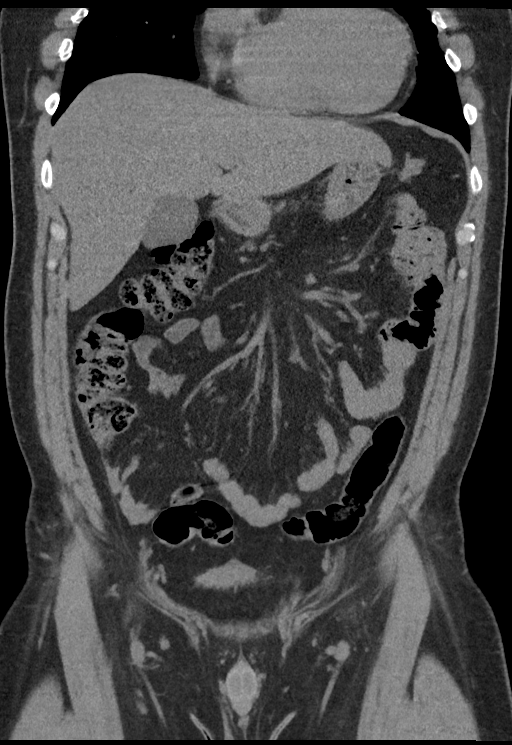
[im 61/137  soft-tissue]
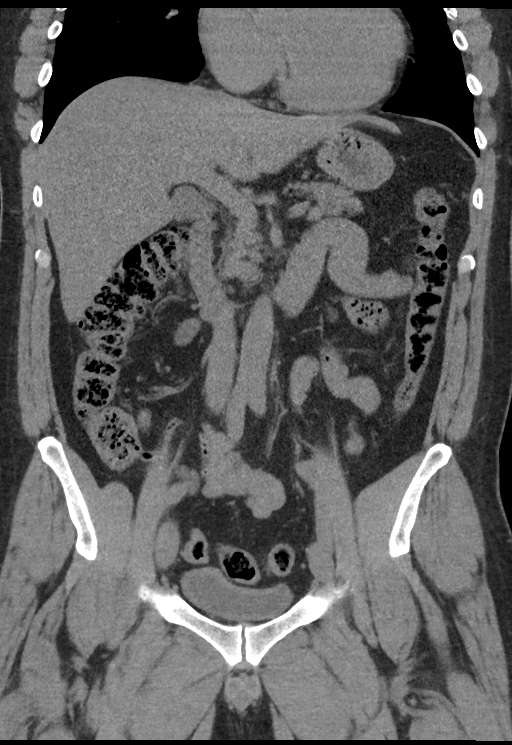
[im 76/137  soft-tissue]
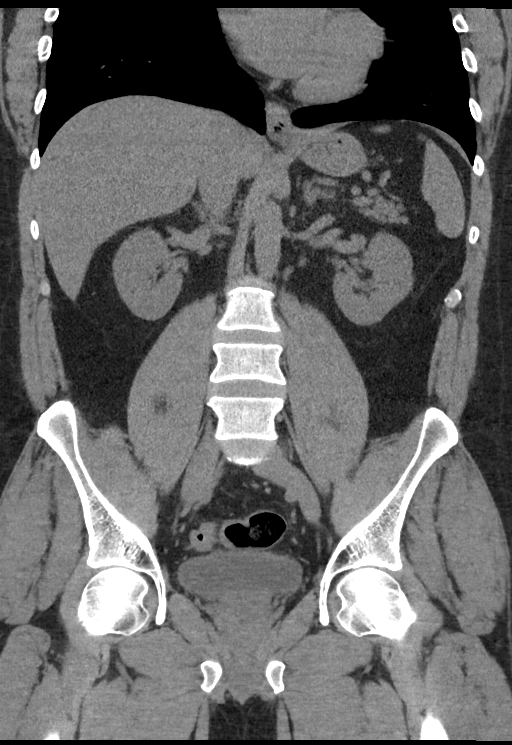

[16 of 46 positions shown; findings below may reference images not displayed]

FINDINGS: Lower chest: No acute abnormality.

Hepatobiliary: No focal liver abnormality is seen. No gallstones,
gallbladder wall thickening, or biliary dilatation.

Pancreas: Unremarkable. No pancreatic ductal dilatation or
surrounding inflammatory changes.

Spleen: Low-attenuation lesion the spleen on series 2, image 25 is
likely a small cyst or hemangioma.

Adrenals/Urinary Tract: Adrenal glands are unremarkable. Kidneys are
normal, without renal calculi, focal lesion, or hydronephrosis.
Bladder is unremarkable.

Stomach/Bowel: Stomach is within normal limits. Appendix appears
normal. No evidence of bowel wall thickening, distention, or
inflammatory changes.

Vascular/Lymphatic: No significant vascular findings are present. No
enlarged abdominal or pelvic lymph nodes.

Reproductive: Prostate is unremarkable.

Other: There is a fat containing left inguinal hernia which is
relatively small. There is a fat containing umbilical hernia. No
free air or free fluid.

Musculoskeletal: No acute or significant osseous findings.
IMPRESSION: 1. No acute abnormality to explain the patient's pain.
2. Fat containing left inguinal hernia. Fat containing umbilical
hernia. Both hernias are small.

## 2019-06-11 ENCOUNTER — Ambulatory Visit: Payer: BC Managed Care – PPO | Attending: Internal Medicine

## 2019-06-11 DIAGNOSIS — Z23 Encounter for immunization: Secondary | ICD-10-CM | POA: Insufficient documentation

## 2019-06-11 NOTE — Progress Notes (Signed)
   Covid-19 Vaccination Clinic  Name:  Arthur Stevens    MRN: [id number] DOB: Dec 25, 1975  06/11/2019  Mr. Arthur Stevens was observed post Covid-19 immunization for 15 minutes without incident. He was provided with Vaccine Information Sheet and instruction to access the V-Safe system.   Mr. Arthur Stevens was instructed to call 911 with any severe reactions post vaccine: Marland Kitchen Difficulty breathing  . Swelling of face and throat  . A fast heartbeat  . A bad rash all over body  . Dizziness and weakness   Immunizations Administered    Name Date Dose VIS Date Route   Pfizer COVID-19 Vaccine 06/11/2019 11:06 AM 0.3 mL 03/17/2019 Intramuscular   Manufacturer: [place]   Lot: [id number]   [place]: [id number]

## 2019-06-15 ENCOUNTER — Other Ambulatory Visit: Payer: Self-pay | Admitting: Primary Care

## 2019-06-15 DIAGNOSIS — E785 Hyperlipidemia, unspecified: Secondary | ICD-10-CM

## 2019-07-11 ENCOUNTER — Ambulatory Visit: Payer: BC Managed Care – PPO | Attending: Internal Medicine

## 2019-07-11 DIAGNOSIS — Z23 Encounter for immunization: Secondary | ICD-10-CM

## 2019-07-11 NOTE — Progress Notes (Signed)
   Covid-19 Vaccination Clinic  Name:  ANASS CARNEGIE    MRN: [id number] DOB: Mar 26, 1976  07/11/2019  Mr. Carfagno was observed post Covid-19 immunization for 15 minutes without incident. He was provided with Vaccine Information Sheet and instruction to access the V-Safe system.   Mr. Lagrone was instructed to call 911 with any severe reactions post vaccine: Marland Kitchen Difficulty breathing  . Swelling of face and throat  . A fast heartbeat  . A bad rash all over body  . Dizziness and weakness   Immunizations Administered    Name Date Dose VIS Date Route   Pfizer COVID-19 Vaccine 07/11/2019 11:49 AM 0.3 mL 03/17/2019 Intramuscular   Manufacturer: [place]   Lot: [id number]   [place]: [id number]

## 2019-07-12 ENCOUNTER — Other Ambulatory Visit: Payer: Self-pay | Admitting: Primary Care

## 2019-07-12 DIAGNOSIS — E785 Hyperlipidemia, unspecified: Secondary | ICD-10-CM

## 2019-07-29 ENCOUNTER — Other Ambulatory Visit: Payer: Self-pay | Admitting: Primary Care

## 2019-07-29 DIAGNOSIS — I10 Essential (primary) hypertension: Secondary | ICD-10-CM

## 2019-08-09 ENCOUNTER — Other Ambulatory Visit: Payer: Self-pay | Admitting: Primary Care

## 2019-08-09 DIAGNOSIS — E785 Hyperlipidemia, unspecified: Secondary | ICD-10-CM

## 2019-08-11 MED ORDER — ATORVASTATIN CALCIUM 20 MG PO TABS: 20.0000 mg | ORAL_TABLET | Freq: Every day | ORAL | 0 refills | Status: DC

## 2019-08-11 NOTE — Addendum Note (Signed)
Addended by: Kris Mouton on: 08/11/2019 01:32 PM   Modules accepted: Orders

## 2019-08-11 NOTE — Telephone Encounter (Signed)
Fax from pharmacy received stating that patient's insurance requires 90 day supply. Re sent RX with the note patient is due for follow up

## 2019-08-23 DIAGNOSIS — L821 Other seborrheic keratosis: Secondary | ICD-10-CM | POA: Diagnosis not present

## 2019-08-23 DIAGNOSIS — L578 Other skin changes due to chronic exposure to nonionizing radiation: Secondary | ICD-10-CM | POA: Diagnosis not present

## 2019-08-23 DIAGNOSIS — L814 Other melanin hyperpigmentation: Secondary | ICD-10-CM | POA: Diagnosis not present

## 2019-08-23 DIAGNOSIS — D225 Melanocytic nevi of trunk: Secondary | ICD-10-CM | POA: Diagnosis not present

## 2019-08-28 ENCOUNTER — Other Ambulatory Visit: Payer: Self-pay | Admitting: Primary Care

## 2019-08-28 DIAGNOSIS — F411 Generalized anxiety disorder: Secondary | ICD-10-CM

## 2019-09-12 ENCOUNTER — Telehealth: Payer: Self-pay | Admitting: Primary Care

## 2019-09-12 DIAGNOSIS — I10 Essential (primary) hypertension: Secondary | ICD-10-CM

## 2019-09-12 MED ORDER — LISINOPRIL 10 MG PO TABS: 10.0000 mg | ORAL_TABLET | Freq: Every day | ORAL | 0 refills | Status: DC

## 2019-09-12 NOTE — Telephone Encounter (Signed)
Patient called today to schedule CPE/LABS He stated he is completely out of his lisinopril (ZESTRIL) 10 MG tablet and has been for a month now. He wanted to know if he could get enough medication to get him to his appointment on 7/6

## 2019-09-12 NOTE — Telephone Encounter (Signed)
Refill 30 days supply to [facility] for patient and notified patient.

## 2019-09-13 ENCOUNTER — Other Ambulatory Visit: Payer: Self-pay

## 2019-09-13 DIAGNOSIS — I10 Essential (primary) hypertension: Secondary | ICD-10-CM

## 2019-09-15 MED ORDER — LISINOPRIL 10 MG PO TABS: 10.0000 mg | ORAL_TABLET | Freq: Every day | ORAL | 0 refills | Status: DC

## 2019-09-22 ENCOUNTER — Other Ambulatory Visit: Payer: Self-pay | Admitting: Primary Care

## 2019-09-22 DIAGNOSIS — R7303 Prediabetes: Secondary | ICD-10-CM

## 2019-09-22 DIAGNOSIS — Z1159 Encounter for screening for other viral diseases: Secondary | ICD-10-CM

## 2019-09-22 DIAGNOSIS — Z114 Encounter for screening for human immunodeficiency virus [HIV]: Secondary | ICD-10-CM

## 2019-09-22 DIAGNOSIS — I10 Essential (primary) hypertension: Secondary | ICD-10-CM

## 2019-09-22 DIAGNOSIS — E785 Hyperlipidemia, unspecified: Secondary | ICD-10-CM

## 2019-10-03 ENCOUNTER — Other Ambulatory Visit (INDEPENDENT_AMBULATORY_CARE_PROVIDER_SITE_OTHER): Payer: BC Managed Care – PPO

## 2019-10-03 DIAGNOSIS — I10 Essential (primary) hypertension: Secondary | ICD-10-CM

## 2019-10-03 DIAGNOSIS — Z1159 Encounter for screening for other viral diseases: Secondary | ICD-10-CM | POA: Diagnosis not present

## 2019-10-03 DIAGNOSIS — R7303 Prediabetes: Secondary | ICD-10-CM

## 2019-10-03 DIAGNOSIS — Z114 Encounter for screening for human immunodeficiency virus [HIV]: Secondary | ICD-10-CM | POA: Diagnosis not present

## 2019-10-03 LAB — CBC
HCT: 43.8 % (ref 39.0–52.0)
Hemoglobin: 14.7 g/dL (ref 13.0–17.0)
MCHC: 33.6 g/dL (ref 30.0–36.0)
MCV: 89.5 fl (ref 78.0–100.0)
Platelets: 229 10*3/uL (ref 150.0–400.0)
RBC: 4.9 Mil/uL (ref 4.22–5.81)
RDW: 13 % (ref 11.5–15.5)
WBC: 5.1 10*3/uL (ref 4.0–10.5)

## 2019-10-03 LAB — COMPREHENSIVE METABOLIC PANEL
ALT: 40 U/L (ref 0–53)
AST: 34 U/L (ref 0–37)
Albumin: 4.5 g/dL (ref 3.5–5.2)
Alkaline Phosphatase: 53 U/L (ref 39–117)
BUN: 11 mg/dL (ref 6–23)
CO2: 28 mEq/L (ref 19–32)
Calcium: 9.7 mg/dL (ref 8.4–10.5)
Chloride: 99 mEq/L (ref 96–112)
Creatinine, Ser: 0.86 mg/dL (ref 0.40–1.50)
GFR: 95.95 mL/min (ref >60.00)
Glucose, Bld: 109 mg/dL — ABNORMAL HIGH (ref 70–99)
Potassium: 4 mEq/L (ref 3.5–5.1)
Sodium: 135 mEq/L (ref 135–145)
Total Bilirubin: 0.9 mg/dL (ref 0.2–1.2)
Total Protein: 7.3 g/dL (ref 6.0–8.3)

## 2019-10-03 LAB — HEMOGLOBIN A1C: Hgb A1c MFr Bld: 5.6 % (ref 4.6–6.5)

## 2019-10-04 LAB — HEPATITIS C ANTIBODY
Hepatitis C Ab: NONREACTIVE
SIGNAL TO CUT-OFF: 0.01 (ref <1.00)

## 2019-10-04 LAB — HIV ANTIBODY (ROUTINE TESTING W REFLEX): HIV 1&2 Ab, 4th Generation: NONREACTIVE

## 2019-10-10 ENCOUNTER — Encounter: Payer: Self-pay | Admitting: Primary Care

## 2019-10-10 ENCOUNTER — Other Ambulatory Visit: Payer: Self-pay

## 2019-10-10 ENCOUNTER — Ambulatory Visit (INDEPENDENT_AMBULATORY_CARE_PROVIDER_SITE_OTHER): Payer: BC Managed Care – PPO | Admitting: Primary Care

## 2019-10-10 VITALS — BP 124/92 | HR 82 | Temp 95.9°F | Ht 72.0 in | Wt 213.0 lb

## 2019-10-10 DIAGNOSIS — E785 Hyperlipidemia, unspecified: Secondary | ICD-10-CM

## 2019-10-10 DIAGNOSIS — Z3009 Encounter for other general counseling and advice on contraception: Secondary | ICD-10-CM

## 2019-10-10 DIAGNOSIS — R7303 Prediabetes: Secondary | ICD-10-CM | POA: Diagnosis not present

## 2019-10-10 DIAGNOSIS — I10 Essential (primary) hypertension: Secondary | ICD-10-CM | POA: Diagnosis not present

## 2019-10-10 DIAGNOSIS — Z1211 Encounter for screening for malignant neoplasm of colon: Secondary | ICD-10-CM

## 2019-10-10 DIAGNOSIS — F411 Generalized anxiety disorder: Secondary | ICD-10-CM

## 2019-10-10 DIAGNOSIS — Z0000 Encounter for general adult medical examination without abnormal findings: Secondary | ICD-10-CM

## 2019-10-10 LAB — LIPID PANEL
Cholesterol: 218 mg/dL — ABNORMAL HIGH (ref 0–200)
HDL: 65 mg/dL (ref >39.00)
NonHDL: 152.99
Total CHOL/HDL Ratio: 3
Triglycerides: 209 mg/dL — ABNORMAL HIGH (ref 0.0–149.0)
VLDL: 41.8 mg/dL — ABNORMAL HIGH (ref 0.0–40.0)

## 2019-10-10 LAB — LDL CHOLESTEROL, DIRECT: Direct LDL: 128 mg/dL

## 2019-10-10 NOTE — Assessment & Plan Note (Signed)
Immunizations UTD. Colon cancer screening due, referral placed to GI. Discussed the importance of a healthy diet and regular exercise in order for weight loss, and to reduce the risk of any potential medical problems.  Exam today unremarkable. Labs reviewed and pending.

## 2019-10-10 NOTE — Patient Instructions (Signed)
You will be contacted regarding your referral to GI for the colonoscopy and Urology.  Please let us know if you have not been contacted within two weeks.   Continue exercising. You should be getting 150 minutes of moderate intensity exercise weekly.  It's important to improve your diet by reducing consumption of fast food, fried food, processed snack foods, sugary drinks. Increase consumption of fresh vegetables and fruits, whole grains, water.  Ensure you are drinking 64 ounces of water daily.  Stop by the lab prior to leaving today. I will notify you of your results once received.   It was a pleasure to see you today!   Preventive Care 5-46 Years Old, Male Preventive care refers to lifestyle choices and visits with your health care provider that can promote health and wellness. This includes:  A yearly physical exam. This is also called an annual well check.  Regular dental and eye exams.  Immunizations.  Screening for certain conditions.  Healthy lifestyle choices, such as eating a healthy diet, getting regular exercise, not using drugs or products that contain nicotine and tobacco, and limiting alcohol use. What can I expect for my preventive care visit? Physical exam Your health care provider will check:  Height and weight. These may be used to calculate body mass index (BMI), which is a measurement that tells if you are at a healthy weight.  Heart rate and blood pressure.  Your skin for abnormal spots. Counseling Your health care provider may ask you questions about:  Alcohol, tobacco, and drug use.  Emotional well-being.  Home and relationship well-being.  Sexual activity.  Eating habits.  Work and work Statistician. What immunizations do I need?  Influenza (flu) vaccine  This is recommended every year. Tetanus, diphtheria, and pertussis (Tdap) vaccine  You may need a Td booster every 10 years. Varicella (chickenpox) vaccine  You may need this vaccine  if you have not already been vaccinated. Zoster (shingles) vaccine  You may need this after age 4. Measles, mumps, and rubella (MMR) vaccine  You may need at least one dose of MMR if you were born in 1957 or later. You may also need a second dose. Pneumococcal conjugate (PCV13) vaccine  You may need this if you have certain conditions and were not previously vaccinated. Pneumococcal polysaccharide (PPSV23) vaccine  You may need one or two doses if you smoke cigarettes or if you have certain conditions. Meningococcal conjugate (MenACWY) vaccine  You may need this if you have certain conditions. Hepatitis A vaccine  You may need this if you have certain conditions or if you travel or work in places where you may be exposed to hepatitis A. Hepatitis B vaccine  You may need this if you have certain conditions or if you travel or work in places where you may be exposed to hepatitis B. Haemophilus influenzae type b (Hib) vaccine  You may need this if you have certain risk factors. Human papillomavirus (HPV) vaccine  If recommended by your health care provider, you may need three doses over 6 months. You may receive vaccines as individual doses or as more than one vaccine together in one shot (combination vaccines). Talk with your health care provider about the risks and benefits of combination vaccines. What tests do I need? Blood tests  Lipid and cholesterol levels. These may be checked every 5 years, or more frequently if you are over 60 years old.  Hepatitis C test.  Hepatitis B test. Screening  Lung cancer screening. You  may have this screening every year starting at age 54 if you have a 30-pack-year history of smoking and currently smoke or have quit within the past 15 years.  Prostate cancer screening. Recommendations will vary depending on your family history and other risks.  Colorectal cancer screening. All adults should have this screening starting at age 46 and  continuing until age 73. Your health care provider may recommend screening at age 14 if you are at increased risk. You will have tests every 1-10 years, depending on your results and the type of screening test.  Diabetes screening. This is done by checking your blood sugar (glucose) after you have not eaten for a while (fasting). You may have this done every 1-3 years.  Sexually transmitted disease (STD) testing. Follow these instructions at home: Eating and drinking  Eat a diet that includes fresh fruits and vegetables, whole grains, lean protein, and low-fat dairy products.  Take vitamin and mineral supplements as recommended by your health care provider.  Do not drink alcohol if your health care provider tells you not to drink.  If you drink alcohol: ? Limit how much you have to 0-2 drinks a day. ? Be aware of how much alcohol is in your drink. In the [place] one drink equals one 12 oz bottle of beer (355 mL), one 5 oz glass of wine (148 mL), or one 1 oz glass of hard liquor (44 mL). Lifestyle  Take daily care of your teeth and gums.  Stay active. Exercise for at least 30 minutes on 5 or more days each week.  Do not use any products that contain nicotine or tobacco, such as cigarettes, e-cigarettes, and chewing tobacco. If you need help quitting, ask your health care provider.  If you are sexually active, practice safe sex. Use a condom or other form of protection to prevent STIs (sexually transmitted infections).  Talk with your health care provider about taking a low-dose aspirin every day starting at age 45. What's next?  Go to your health care provider once a year for a well check visit.  Ask your health care provider how often you should have your eyes and teeth checked.  Stay up to date on all vaccines. This information is not intended to replace advice given to you by your health care provider. Make sure you discuss any questions you have with your health care  provider. Document Revised: 03/17/2018 Document Reviewed: 03/17/2018 Elsevier Patient Education  2020 [employer].

## 2019-10-10 NOTE — Assessment & Plan Note (Signed)
Repeat lipid panel pending, compliant to atorvastatin. Continue same.

## 2019-10-10 NOTE — Progress Notes (Signed)
Subjective:    Patient ID: Arthur Stevens, male    DOB: 1975-12-20, 44 y.o.   MRN: [id number]  HPI  This visit occurred during the SARS-CoV-2 public health emergency.  Safety protocols were in place, including screening questions prior to the visit, additional usage of staff PPE, and extensive cleaning of exam room while observing appropriate contact time as indicated for disinfecting solutions.   Arthur Stevens is a 44 year old male who presents today for complete physical.  Immunizations: -Tetanus: Completed in 2015 -Influenza: Completed this season  -Covid-19: Completed series    Diet: Arthur Stevens endorses a fair diet.  Exercise: Walking, active at home.   Eye exam: No recent exam  Dental exam: Completes semi-annually   Colonoscopy: Due Hep C Screen: Negative   BP Readings from Last 3 Encounters:  10/10/19 (!) 124/92  02/09/19 (!) 134/92  01/02/19 (!) 142/86   Arthur Stevens is checking his BP at home which is running 110's/80's for the most part. Arthur Stevens denies chest pain, dizziness, visual changes.   Review of Systems  Constitutional: Negative for unexpected weight change.  HENT: Negative for rhinorrhea.   Respiratory: Negative for cough and shortness of breath.   Cardiovascular: Negative for chest pain.  Gastrointestinal: Negative for constipation and diarrhea.  Genitourinary: Negative for difficulty urinating.  Musculoskeletal: Negative for arthralgias and myalgias.  Skin: Negative for rash.  Allergic/Immunologic: Negative for environmental allergies.  Neurological: Negative for dizziness, numbness and headaches.  Psychiatric/Behavioral: The patient is not nervous/anxious.        Past Medical History:  Diagnosis Date  . Allergy   . Hyperlipidemia      Social History   Socioeconomic History  . Marital status: Married    Spouse name: Not on file  . Number of children: Not on file  . Years of education: Not on file  . Highest education level: Not on file  Occupational History  .  Not on file  Tobacco Use  . Smoking status: Never Smoker  . Smokeless tobacco: Never Used  Vaping Use  . Vaping Use: Never used  Substance and Sexual Activity  . Alcohol use: Yes    Comment: 2 x beers per day  . Drug use: No  . Sexual activity: Yes    Birth control/protection: None  Other Topics Concern  . Not on file  Social History Narrative   Married.   2 children.   Works as a Financial risk analyst.   Enjoys spending time with children, playing golf   Social Determinants of Health   Financial Resource Strain:   . Difficulty of Paying Living Expenses:   Food Insecurity:   . Worried About Charity fundraiser in the Last Year:   . Arboriculturist in the Last Year:   Transportation Needs:   . Film/video editor (Medical):   Marland Kitchen Lack of Transportation (Non-Medical):   Physical Activity:   . Days of Exercise per Week:   . Minutes of Exercise per Session:   Stress:   . Feeling of Stress :   Social Connections:   . Frequency of Communication with Friends and Family:   . Frequency of Social Gatherings with Friends and Family:   . Attends Religious Services:   . Active Member of Clubs or Organizations:   . Attends Archivist Meetings:   Marland Kitchen Marital Status:   Intimate Partner Violence:   . Fear of Current or Ex-Partner:   . Emotionally Abused:   Marland Kitchen Physically Abused:   .  Sexually Abused:     No past surgical history on file.  Family History  Problem Relation Age of Onset  . Hyperlipidemia Mother   . Hypertension Mother   . Cancer Sister     No Known Allergies  Current Outpatient Medications on File Prior to Visit  Medication Sig Dispense Refill  . Ascorbic Acid (VITAMIN C) 100 MG tablet Take 100 mg by mouth daily.    Marland Kitchen atorvastatin (LIPITOR) 20 MG tablet Take 1 tablet (20 mg total) by mouth daily. NEED APPOINTMENT OVERDUE FOR RECHECK 90 tablet 0  . cetirizine (ZYRTEC) 10 MG tablet Take 10 mg by mouth daily.    . citalopram (CELEXA) 10 MG tablet TAKE 1  TABLET (10 MG TOTAL) BY MOUTH DAILY. FOR ANXIETY. 90 tablet 1  . diphenhydramine-acetaminophen (TYLENOL PM) 25-500 MG TABS Take 1 tablet by mouth at bedtime as needed.    Marland Kitchen lisinopril (ZESTRIL) 10 MG tablet Take 1 tablet (10 mg total) by mouth daily. For blood pressure 90 tablet 0  . Multiple Vitamin (MULTIVITAMIN) tablet Take 1 tablet by mouth daily.    Marland Kitchen zinc gluconate 50 MG tablet Take 50 mg by mouth daily.     No current facility-administered medications on file prior to visit.    BP (!) 124/92   Pulse 82   Temp (!) 95.9 F (35.5 C) (Temporal)   Ht 6' (1.829 m)   Wt 213 lb (96.6 kg)   SpO2 98%   BMI 28.89 kg/m    Objective:   Physical Exam HENT:     Right Ear: Tympanic membrane and ear canal normal.     Left Ear: Tympanic membrane and ear canal normal.  Eyes:     Pupils: Pupils are equal, round, and reactive to light.  Cardiovascular:     Rate and Rhythm: Normal rate and regular rhythm.  Pulmonary:     Effort: Pulmonary effort is normal.     Breath sounds: Normal breath sounds.  Abdominal:     General: Bowel sounds are normal.     Palpations: Abdomen is soft.     Tenderness: There is no abdominal tenderness.  Musculoskeletal:        General: Normal range of motion.     Cervical back: Neck supple.  Skin:    General: Skin is warm and dry.  Neurological:     Mental Status: Arthur Stevens is alert and oriented to person, place, and time.     Cranial Nerves: No cranial nerve deficit.     Deep Tendon Reflexes:     Reflex Scores:      Patellar reflexes are 2+ on the right side and 2+ on the left side. Psychiatric:        Mood and Affect: Mood normal.            Assessment & Plan:

## 2019-10-10 NOTE — Assessment & Plan Note (Signed)
Overall doing well on citalopram 10 mg, continue same.

## 2019-10-10 NOTE — Assessment & Plan Note (Signed)
Above goal in the office today, home readings are normal. Encouraged him to continue to monitor home readings, report readings consistently at or above 130/90.

## 2019-10-10 NOTE — Assessment & Plan Note (Signed)
Recent A1C within normal range. Continue to monitor.

## 2019-11-02 ENCOUNTER — Other Ambulatory Visit: Payer: Self-pay | Admitting: Primary Care

## 2019-11-02 DIAGNOSIS — E785 Hyperlipidemia, unspecified: Secondary | ICD-10-CM

## 2019-11-24 DIAGNOSIS — F411 Generalized anxiety disorder: Secondary | ICD-10-CM

## 2019-11-24 MED ORDER — CITALOPRAM HYDROBROMIDE 10 MG PO TABS: 10.0000 mg | ORAL_TABLET | Freq: Every day | ORAL | 1 refills | Status: DC

## 2019-12-18 ENCOUNTER — Other Ambulatory Visit: Payer: Self-pay

## 2019-12-18 DIAGNOSIS — I10 Essential (primary) hypertension: Secondary | ICD-10-CM

## 2019-12-18 MED ORDER — LISINOPRIL 10 MG PO TABS: 10.0000 mg | ORAL_TABLET | Freq: Every day | ORAL | 3 refills | Status: DC

## 2020-02-01 ENCOUNTER — Other Ambulatory Visit: Payer: Self-pay | Admitting: Primary Care

## 2020-02-01 DIAGNOSIS — E785 Hyperlipidemia, unspecified: Secondary | ICD-10-CM

## 2020-04-30 ENCOUNTER — Ambulatory Visit (INDEPENDENT_AMBULATORY_CARE_PROVIDER_SITE_OTHER): Payer: BC Managed Care – PPO | Admitting: Primary Care

## 2020-04-30 ENCOUNTER — Other Ambulatory Visit: Payer: Self-pay

## 2020-04-30 ENCOUNTER — Encounter: Payer: Self-pay | Admitting: Primary Care

## 2020-04-30 VITALS — BP 124/78 | HR 83 | Temp 97.9°F | Ht 72.0 in | Wt 221.0 lb

## 2020-04-30 DIAGNOSIS — R1319 Other dysphagia: Secondary | ICD-10-CM | POA: Insufficient documentation

## 2020-04-30 DIAGNOSIS — F411 Generalized anxiety disorder: Secondary | ICD-10-CM

## 2020-04-30 DIAGNOSIS — Z1211 Encounter for screening for malignant neoplasm of colon: Secondary | ICD-10-CM | POA: Diagnosis not present

## 2020-04-30 DIAGNOSIS — I10 Essential (primary) hypertension: Secondary | ICD-10-CM

## 2020-04-30 NOTE — Progress Notes (Signed)
Subjective:    Patient ID: Arthur Stevens, male    DOB: 1975/05/13, 45 y.o.   MRN: [id number]  HPI  This visit occurred during the SARS-CoV-2 public health emergency.  Safety protocols were in place, including screening questions prior to the visit, additional usage of staff PPE, and extensive cleaning of exam room while observing appropriate contact time as indicated for disinfecting solutions.   Arthur Stevens is a 45 year old male with a history of hyperlipidemia, hypertension, prediabetes who presents today to discuss esophageal spasms.  He has also noticed high blood pressure readings at home.  He's been under a lot stress with work over the last year, he's been checking his BP at home with a wrist and arm cuff and is getting readings of 140's-150's/90's-low 100's. He is checking his BP when he's stressed or not feeling well. He is compliant to his lisinopril 10 mg daily.  He denies headaches, dizziness, visual disturbance.  He feels as though his citalopram 10 mg has been effective overall.  Suspects his elevated blood pressure readings are secondary to stress.  He has noticed spasms for which he notices to the upper esophagus during some mealtimes.  He will swallow and feel the food lodge in the upper esophagus which will slowly move down over time causing a lot of discomfort. Symptoms began about one year ago, have increased in frequency, now.occurring 1-2 times weekly. On new years eve he was eating prime rib, felt the spasm and had to vomit up his food to avoid choking. Hot and cold food extremes, beef, milk, bread seem to provoke symptoms.  He does belch frequently, his wife has noticed this. Worse on the weekend for which he attributes to his poor diet on the weekends.  He will drink beer and carbonated beverages.  He denies a family history of esophageal spasms, dysphagia.  He denies esophageal burning, epigastric pain, throat fullness.  He has not taken anything for his symptoms.  BP  Readings from Last 3 Encounters:  04/30/20 124/78  10/10/19 (!) 124/92  02/09/19 (!) 134/92     Review of Systems  Eyes: Negative for visual disturbance.  Respiratory: Negative for shortness of breath.   Cardiovascular: Negative for chest pain.  Gastrointestinal:       Belching, dysphagia  Neurological: Negative for dizziness and headaches.       Past Medical History:  Diagnosis Date  . Allergy   . Hyperlipidemia      Social History   Socioeconomic History  . Marital status: Married    Spouse name: Not on file  . Number of children: Not on file  . Years of education: Not on file  . Highest education level: Not on file  Occupational History  . Not on file  Tobacco Use  . Smoking status: Never Smoker  . Smokeless tobacco: Never Used  Vaping Use  . Vaping Use: Never used  Substance and Sexual Activity  . Alcohol use: Yes    Comment: 2 x beers per day  . Drug use: No  . Sexual activity: Yes    Birth control/protection: None  Other Topics Concern  . Not on file  Social History Narrative   Married.   2 children.   Works as a Financial risk analyst.   Enjoys spending time with children, playing golf   Social Determinants of Health   Financial Resource Strain: Not on file  Food Insecurity: Not on file  Transportation Needs: Not on file  Physical Activity:  Not on file  Stress: Not on file  Social Connections: Not on file  Intimate Partner Violence: Not on file    History reviewed. No pertinent surgical history.  Family History  Problem Relation Age of Onset  . Hyperlipidemia Mother   . Hypertension Mother   . Cancer Sister     No Known Allergies  Current Outpatient Medications on File Prior to Visit  Medication Sig Dispense Refill  . Ascorbic Acid (VITAMIN C) 100 MG tablet Take 100 mg by mouth daily.    Marland Kitchen atorvastatin (LIPITOR) 20 MG tablet TAKE 1 TABLET BY MOUTH EVERY DAY 90 tablet 1  . cetirizine (ZYRTEC) 10 MG tablet Take 10 mg by mouth daily.    .  citalopram (CELEXA) 10 MG tablet Take 1 tablet (10 mg total) by mouth daily. For anxiety. 90 tablet 1  . diphenhydramine-acetaminophen (TYLENOL PM) 25-500 MG TABS Take 1 tablet by mouth at bedtime as needed.    Marland Kitchen lisinopril (ZESTRIL) 10 MG tablet Take 1 tablet (10 mg total) by mouth daily. For blood pressure 90 tablet 3  . Multiple Vitamin (MULTIVITAMIN) tablet Take 1 tablet by mouth daily.    Marland Kitchen zinc gluconate 50 MG tablet Take 50 mg by mouth daily.     No current facility-administered medications on file prior to visit.    BP 124/78   Pulse 83   Temp 97.9 F (36.6 C) (Temporal)   Ht 6' (1.829 m)   Wt 221 lb (100.2 kg)   SpO2 98%   BMI 29.97 kg/m    Objective:   Physical Exam Constitutional:      Appearance: He is well-nourished.  Cardiovascular:     Rate and Rhythm: Normal rate and regular rhythm.  Pulmonary:     Effort: Pulmonary effort is normal.     Breath sounds: Normal breath sounds.  Musculoskeletal:     Cervical back: Neck supple.  Skin:    General: Skin is warm and dry.  Psychiatric:        Mood and Affect: Mood and affect normal.            Assessment & Plan:

## 2020-04-30 NOTE — Patient Instructions (Addendum)
Try to check your blood pressure when you've been resting and calm for 30 minutes.   You will be contacted regarding your referral to GI.  Please let us know if you have not been contacted within two weeks.   We will see you in July 2022 for your annual physical.  It was a pleasure to see you today!    Bradley's Neurology in [place] (8th ed., pp. [phone]). [place], [place]: Elsevier."> Marya Amsler of Surgery (21st ed., pp. [phone]). [place], [place]: Elsevier[employer] Inc.">  Dysphagia  Dysphagia is trouble swallowing. This condition occurs when solids and liquids stick in a person's throat on the way down to the stomach, or when food takes longer to get to the stomach than usual. You may have problems swallowing food, liquids, or both. You may also have pain while trying to swallow. It may take you more time and effort to swallow something. What are the causes? This condition may be caused by:  Muscle problems. These may make it difficult for you to move food and liquids through the esophagus, which is the tube that connects your mouth to your stomach.  Blockages. You may have ulcers, scar tissue, or inflammation that blocks the normal passage of food and liquids. Causes of these problems include: ? Acid reflux from your stomach into your esophagus (gastroesophageal reflux). ? Infections. ? Radiation treatment for cancer. ? Medicines taken without enough fluids to wash them down into your stomach.  Stroke. This can affect the nerves and make it difficult to swallow.  Nerve problems. These prevent signals from being sent to the muscles of your esophagus to squeeze (contract) and move what you swallow down to your stomach.  Globus pharyngeus. This is a common problem that involves a feeling like something is stuck in your throat or a sense of trouble with swallowing, even though nothing is wrong with the swallowing passages.  Certain conditions, such as cerebral palsy or  Parkinson's disease. What are the signs or symptoms? Common symptoms of this condition include:  A feeling that solids or liquids are stuck in your throat on the way down to the stomach.  Pain while swallowing.  Coughing or gagging while trying to swallow. Other symptoms include:  Food moving back from your stomach to your mouth (regurgitation).  Noises coming from your throat.  Chest discomfort when swallowing.  A feeling of fullness when swallowing.  Drooling, especially when the throat is blocked.  Heartburn. How is this diagnosed? This condition may be diagnosed by:  Barium swallow X-ray. In this test, you will swallow a white liquid that sticks to the inside of your esophagus. X-ray images are then taken.  Endoscopy. In this test, a flexible telescope is inserted down your throat to look at your esophagus and your stomach.  CT scans or an MRI. How is this treated? Treatment for dysphagia depends on the cause of this condition:  If the dysphagia is caused by acid reflux or infection, medicines may be used. These may include antibiotics or heartburn medicines.  If the dysphagia is caused by problems with the muscles, swallowing therapy may be used to help you strengthen your swallowing muscles. You may have to do specific exercises to strengthen the muscles or stretch them.  If the dysphagia is caused by a blockage or mass, procedures to remove the blockage may be done. You may need surgery and a feeding tube. You may need to make diet changes. Ask your health care provider for specific instructions. Follow  these instructions at home: Medicines  Take over-the-counter and prescription medicines only as told by your health care provider.  If you were prescribed an antibiotic medicine, take it as told by your health care provider. Do not stop taking the antibiotic even if you start to feel better. Eating and drinking  Make any diet changes as told by your health care  provider.  Work with a diet and nutrition specialist (dietitian) to create an eating plan that will help you get the nutrients you need in order to stay healthy.  Eat soft foods that are easier to swallow.  Cut your food into small pieces and eat slowly. Take small bites.  Eat and drink only when you are sitting upright.  Do not drink alcohol or caffeine. If you need help quitting, ask your health care provider.   General instructions  Check your weight every day to make sure you are not losing weight.  Do not use any products that contain nicotine or tobacco. These products include cigarettes, chewing tobacco, and vaping devices, such as e-cigarettes. If you need help quitting, ask your health care provider.  Keep all follow-up visits. This is important. Contact a health care provider if:  You lose weight because you cannot swallow.  You cough when you drink liquids.  You cough up partially digested food. Get help right away if:  You cannot swallow your saliva.  You have shortness of breath, a fever, or both.  Your voice is hoarse and you have trouble swallowing. These symptoms may represent a serious problem that is an emergency. Do not wait to see if the symptoms will go away. Get medical help right away. Call your local emergency services (911 in the [place] Do not drive yourself to the hospital. Summary  Dysphagia is trouble swallowing. This condition occurs when solids and liquids stick in a person's throat on the way down to the stomach. You may cough or gag while trying to swallow.  Dysphagia has many possible causes.  Treatment for dysphagia depends on the cause of the condition.  Keep all follow-up visits. This is important. This information is not intended to replace advice given to you by your health care provider. Make sure you discuss any questions you have with your health care provider. Document Revised: 11/11/2019 Document Reviewed: 11/11/2019 Elsevier  Patient Education  [place].       Influenza (Flu) Vaccine (Inactivated or Recombinant): What You Need to Know 1. Why get vaccinated? Influenza vaccine can prevent influenza (flu). Flu is a contagious disease that spreads around the [place] every year, usually between October and May. Anyone can get the flu, but it is more dangerous for some people. Infants and young children, people 59 years and older, pregnant people, and people with certain health conditions or a weakened immune system are at greatest risk of flu complications. Pneumonia, bronchitis, sinus infections, and ear infections are examples of flu-related complications. If you have a medical condition, such as heart disease, cancer, or diabetes, flu can make it worse. Flu can cause fever and chills, sore throat, muscle aches, fatigue, cough, headache, and runny or stuffy nose. Some people may have vomiting and diarrhea, though this is more common in children than adults. In an average year, thousands of people in the [place] die from flu, and many more are hospitalized. Flu vaccine prevents millions of illnesses and flu-related visits to the doctor each year. 2. Influenza vaccines CDC recommends everyone 6 months and older get  vaccinated every flu season. Children 6 months through 27 years of age may need 2 doses during a single flu season. Everyone else needs only 1 dose each flu season. It takes about 2 weeks for protection to develop after vaccination. There are many flu viruses, and they are always changing. Each year a new flu vaccine is made to protect against the influenza viruses believed to be likely to cause disease in the upcoming flu season. Even when the vaccine doesn't exactly match these viruses, it may still provide some protection. Influenza vaccine does not cause flu. Influenza vaccine may be given at the same time as other vaccines. 3. Talk with your health care provider Tell your vaccination  provider if the person getting the vaccine:  Has had an allergic reaction after a previous dose of influenza vaccine, or has any severe, life-threatening allergies  Has ever had Guillain-Barr Syndrome (also called "GBS") In some cases, your health care provider may decide to postpone influenza vaccination until a future visit. Influenza vaccine can be administered at any time during pregnancy. People who are or will be pregnant during influenza season should receive inactivated influenza vaccine. People with minor illnesses, such as a cold, may be vaccinated. People who are moderately or severely ill should usually wait until they recover before getting influenza vaccine. Your health care provider can give you more information. 4. Risks of a vaccine reaction  Soreness, redness, and swelling where the shot is given, fever, muscle aches, and headache can happen after influenza vaccination.  There may be a very small increased risk of Guillain-Barr Syndrome (GBS) after inactivated influenza vaccine (the flu shot). Young children who get the flu shot along with pneumococcal vaccine (PCV13) and/or DTaP vaccine at the same time might be slightly more likely to have a seizure caused by fever. Tell your health care provider if a child who is getting flu vaccine has ever had a seizure. People sometimes faint after medical procedures, including vaccination. Tell your provider if you feel dizzy or have vision changes or ringing in the ears. As with any medicine, there is a very remote chance of a vaccine causing a severe allergic reaction, other serious injury, or death. 5. What if there is a serious problem? An allergic reaction could occur after the vaccinated person leaves the clinic. If you see signs of a severe allergic reaction (hives, swelling of the face and throat, difficulty breathing, a fast heartbeat, dizziness, or weakness), call 9-1-1 and get the person to the nearest hospital. For other  signs that concern you, call your health care provider. Adverse reactions should be reported to the Vaccine Adverse Event Reporting System (VAERS). Your health care provider will usually file this report, or you can do it yourself. Visit the VAERS website at www.vaers.SamedayNews.es or call (515)242-1926. VAERS is only for reporting reactions, and VAERS staff members do not give medical advice. 6. The National Vaccine Injury Compensation Program The [employer] Vaccine Injury Compensation Program (VICP) is a federal program that was created to compensate people who may have been injured by certain vaccines. Claims regarding alleged injury or death due to vaccination have a time limit for filing, which may be as short as two years. Visit the VICP website at GoldCloset.com.ee or call 769-047-3071 to learn about the program and about filing a claim. 7. How can I learn more?  Ask your health care provider.  Call your local or state health department.  Visit the website of the Food and Drug Administration (FDA)  for vaccine package inserts and additional information at TraderRating.uy.  Contact the Centers for Disease Control and Prevention (CDC): ? Call (769)304-8142 (1-800-CDC-INFO) or ? Visit CDC's website at https://gibson.com/. Vaccine Information Statement Inactivated Influenza Vaccine (11/10/2019) This information is not intended to replace advice given to you by your health care provider. Make sure you discuss any questions you have with your health care provider. Document Revised: 12/28/2019 Document Reviewed: 12/28/2019 Elsevier Patient Education  2021 [employer].

## 2020-04-30 NOTE — Assessment & Plan Note (Signed)
Improved and at goal today in the office today.  I requested that he check his blood pressure routinely, at a time of day were he is well rested.  I discussed to avoid taking blood pressure routinely when feeling stressed and not well.  We also discussed to use his arm cuff, same side of arm.  He will monitor.  Continue lisinopril 10 mg daily.

## 2020-04-30 NOTE — Assessment & Plan Note (Signed)
Chronic for the last year, progressing now. Discussed to chew food very thoroughly, avoid foods that provoke symptoms.  Referral placed to GI for evaluation and also for screening colonoscopy.

## 2020-04-30 NOTE — Assessment & Plan Note (Signed)
Chronic stress due to conditions with COVID-19 pandemic at work.  We discussed his stress and how it can affect his overall health.  For now he would like to continue the citalopram at 10 mg.  He will update if he would like to increase the dose.

## 2020-05-17 ENCOUNTER — Other Ambulatory Visit: Payer: Self-pay | Admitting: Primary Care

## 2020-05-17 DIAGNOSIS — F411 Generalized anxiety disorder: Secondary | ICD-10-CM

## 2020-05-25 IMAGING — CR DG CHEST 2V
2 series · 2 of 2 positions shown · non-contrast
Comparison: None.

CLINICAL DATA: Chest pain for 3 days.  Hypertension.

EXAM:
CHEST - 2 VIEW

[chest pa]
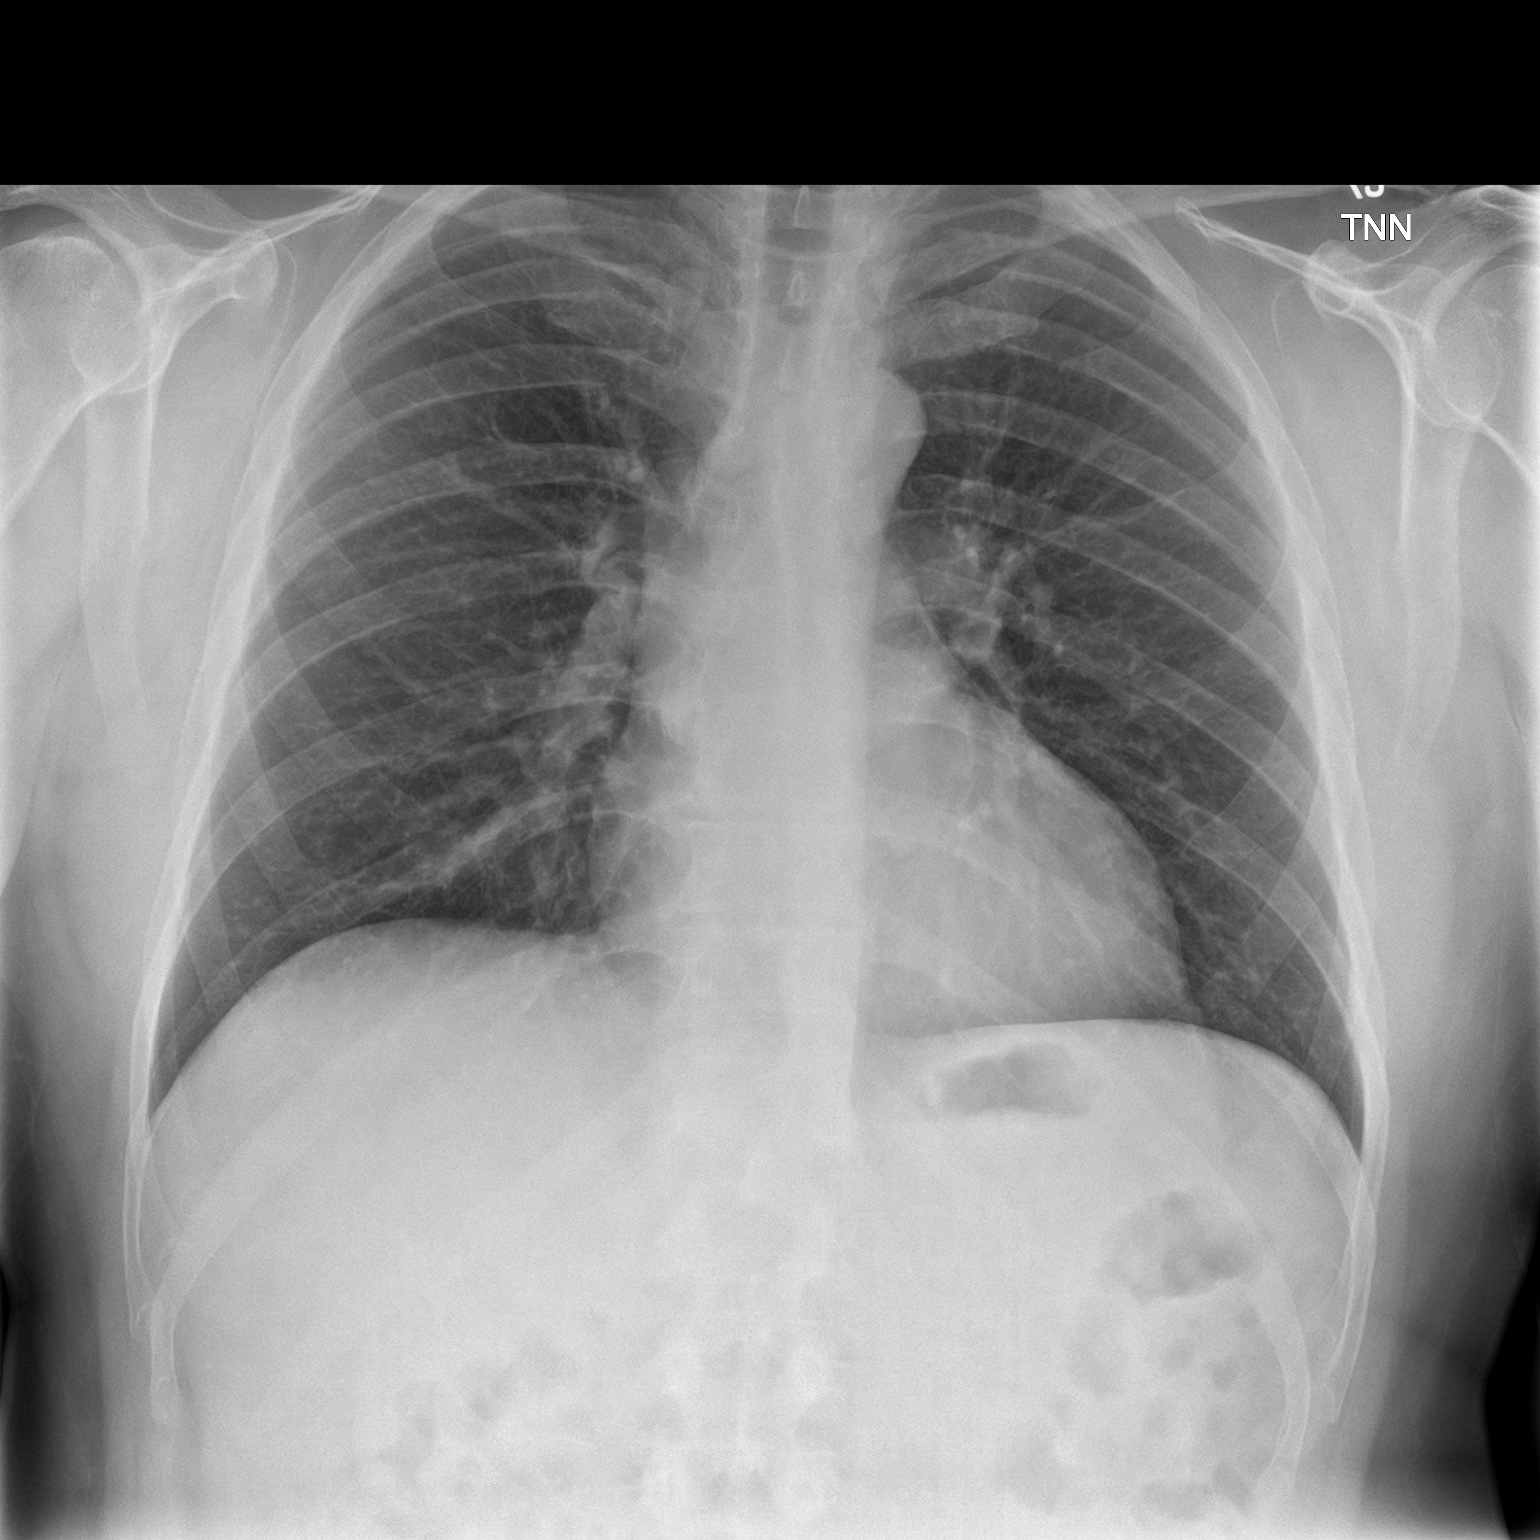

[chest lat]
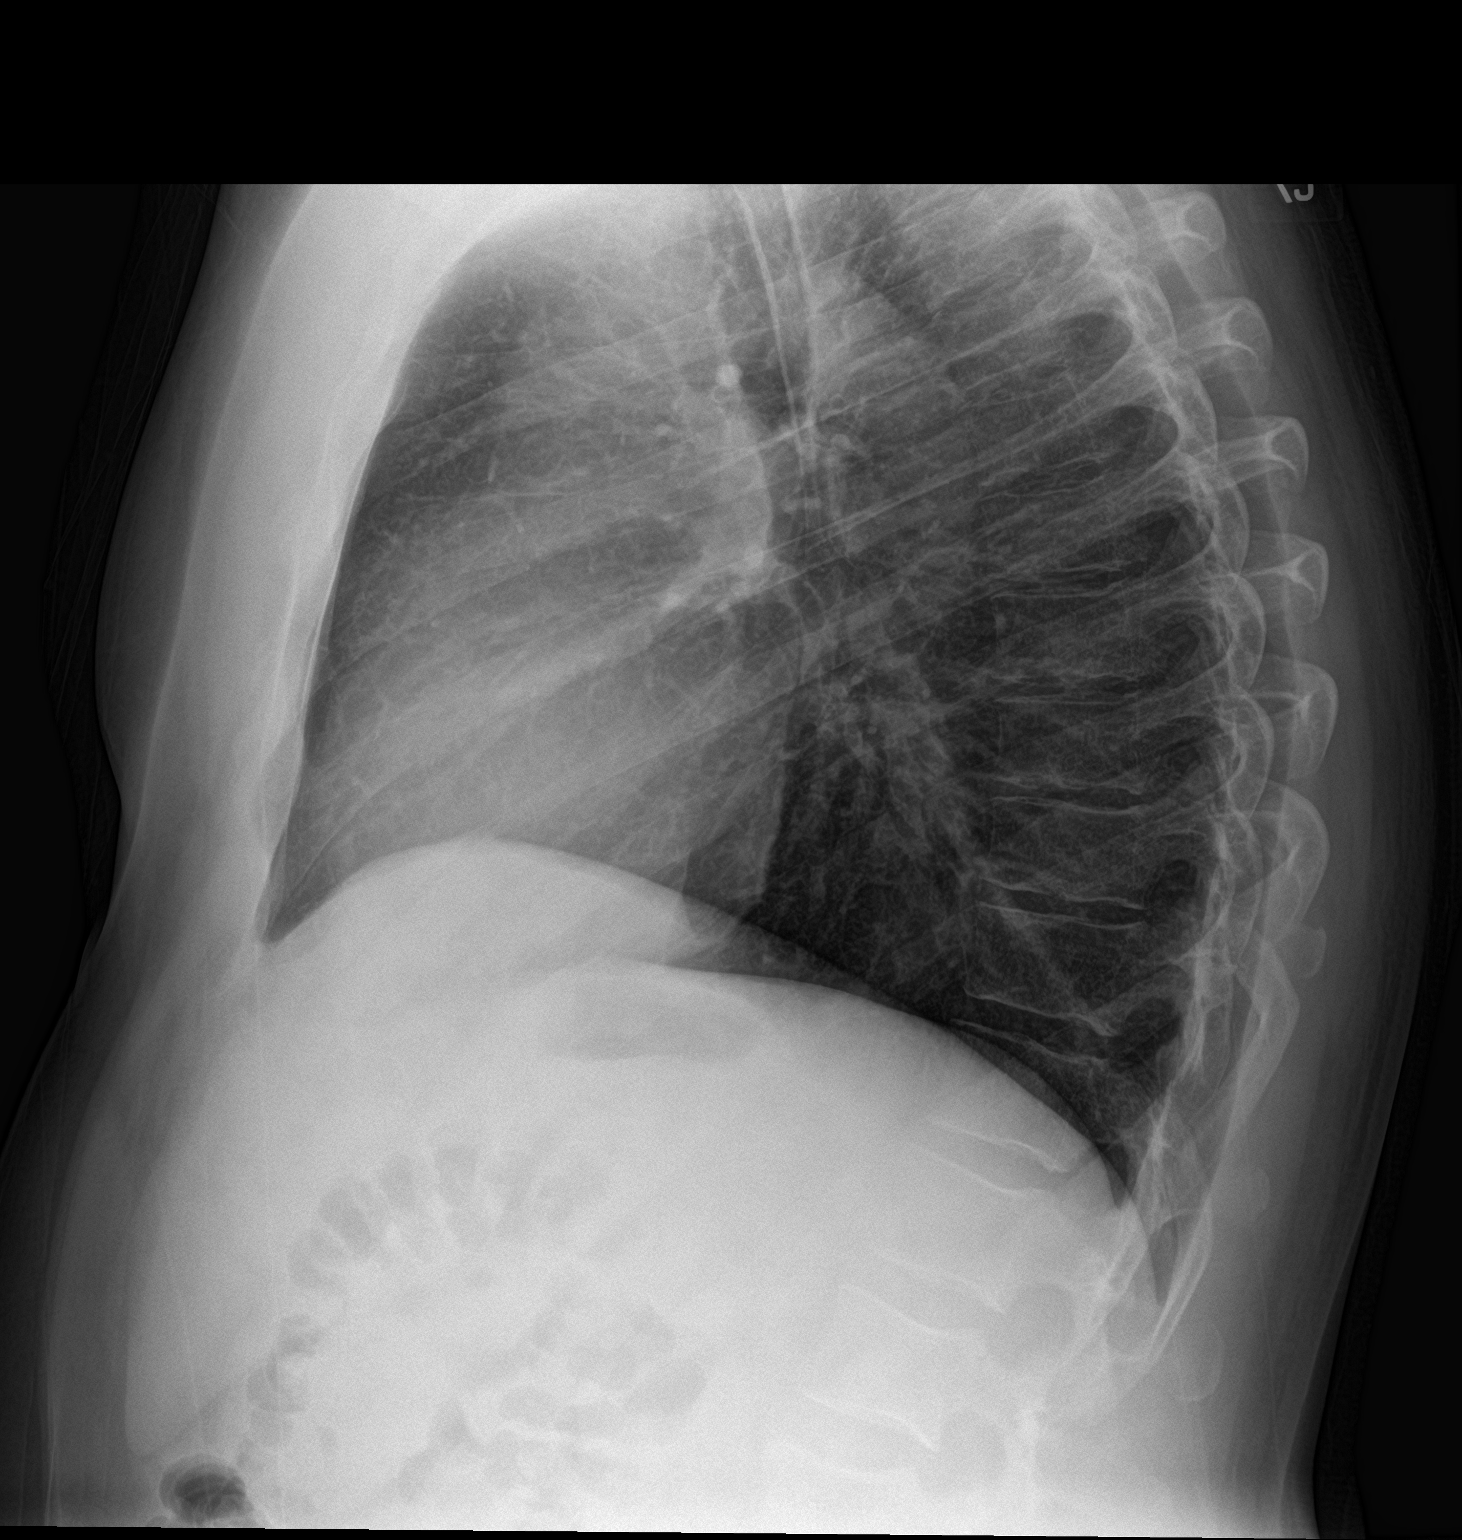

[2 of 2 positions shown; findings below may reference images not displayed]

FINDINGS: The heart size and mediastinal contours are within normal limits.
Both lungs are clear. The visualized skeletal structures are
unremarkable.
IMPRESSION: Negative.  No active cardiopulmonary disease.

## 2020-06-26 ENCOUNTER — Other Ambulatory Visit: Payer: Self-pay

## 2020-06-26 ENCOUNTER — Encounter: Payer: Self-pay | Admitting: Gastroenterology

## 2020-06-26 ENCOUNTER — Ambulatory Visit (INDEPENDENT_AMBULATORY_CARE_PROVIDER_SITE_OTHER): Payer: BC Managed Care – PPO | Admitting: Gastroenterology

## 2020-06-26 VITALS — BP 131/89 | HR 82 | Ht 72.0 in | Wt 226.6 lb

## 2020-06-26 DIAGNOSIS — R1319 Other dysphagia: Secondary | ICD-10-CM

## 2020-06-26 DIAGNOSIS — Z1211 Encounter for screening for malignant neoplasm of colon: Secondary | ICD-10-CM

## 2020-06-26 MED ORDER — PEG 3350-KCL-NA BICARB-NACL 420 G PO SOLR: 4000.0000 mL | Freq: Once | ORAL | 0 refills | Status: AC

## 2020-06-26 NOTE — Progress Notes (Signed)
Arthur Stevens [address]  [place]  [place], [place] [address]  Main: [phone]  Fax: [phone]   Gastroenterology Consultation  Referring Provider:     Pleas Koch, NP Primary Care Physician:  Pleas Koch, NP Reason for Consultation:   Dysphagia        HPI:    Chief Complaint  Patient presents with  . New Patient (Initial Visit)    Dysphagia; discuss EGD    Arthur Stevens is a 45 y.o. y/o male referred for consultation & management  by Dr. Carlis Abbott, Leticia Penna, NP.  Patient reports intermittent dysphagia to solid foods ongoing for about 1 to 2 years.  States most episodes are mild and some episodes have been severe.  During the severe episodes he has to stop what he is eating, go to the bathroom and bring food back up..  Never has had to go to the ER for food impaction.  Has been having EGD.  No prior colonoscopy.  No immediate family members with colon cancer.  Had a grandparent with colon cancer.  No blood in stool.  No altered bowel habits.  No nausea or vomiting.  Denies any abdominal pain  Past Medical History:  Diagnosis Date  . Allergy   . Hyperlipidemia     No past surgical history on file.  Prior to Admission medications   Medication Sig Start Date End Date Taking? Authorizing Provider  Ascorbic Acid (VITAMIN C) 100 MG tablet Take 100 mg by mouth daily.   Yes [provider]  atorvastatin (LIPITOR) 20 MG tablet TAKE 1 TABLET BY MOUTH EVERY DAY 02/01/20  Yes Pleas Koch, NP  cetirizine (ZYRTEC) 10 MG tablet Take 10 mg by mouth daily.   Yes [provider]  citalopram (CELEXA) 10 MG tablet TAKE 1 TABLET (10 MG TOTAL) BY MOUTH DAILY. FOR ANXIETY. 05/17/20  Yes Pleas Koch, NP  diphenhydramine-acetaminophen (TYLENOL PM) 25-500 MG TABS Take 1 tablet by mouth at bedtime as needed.   Yes [provider]  lisinopril (ZESTRIL) 10 MG tablet Take 1 tablet (10 mg total) by mouth daily. For blood pressure  12/18/19  Yes Pleas Koch, NP  Multiple Vitamin (MULTIVITAMIN) tablet Take 1 tablet by mouth daily.   Yes [provider]  zinc gluconate 50 MG tablet Take 50 mg by mouth daily.   Yes [provider]  polyethylene glycol-electrolytes (NULYTELY) 420 g solution Take 4,000 mLs by mouth once for 1 dose. 06/26/20 06/26/20  Virgel Manifold, MD    Family History  Problem Relation Age of Onset  . Hyperlipidemia Mother   . Hypertension Mother   . Cancer Sister      Social History   Tobacco Use  . Smoking status: Never Smoker  . Smokeless tobacco: Never Used  Vaping Use  . Vaping Use: Never used  Substance Use Topics  . Alcohol use: Yes    Comment: 2 x beers per day  . Drug use: No    Allergies as of 06/26/2020  . (No Known Allergies)    Review of Systems:    All systems reviewed and negative except where noted in HPI.   Physical Exam:  BP 131/89 (BP Location: Right Arm, Patient Position: Sitting, Cuff Size: Large)   Pulse 82   Ht 6' (1.829 m)   Wt 226 lb 9.6 oz (102.8 kg)   BMI 30.73 kg/m  No LMP for male patient. Psych:  Alert and cooperative. Normal  mood and affect. General:   Alert,  Well-developed, well-nourished, pleasant and cooperative in NAD Head:  Normocephalic and atraumatic. Eyes:  Sclera clear, no icterus.   Conjunctiva pink. Ears:  Normal auditory acuity. Nose:  No deformity, discharge, or lesions. Mouth:  No deformity or lesions,oropharynx pink & moist. Neck:  Supple; no masses or thyromegaly. Abdomen:  Normal bowel sounds.  No bruits.  Soft, non-tender and non-distended without masses, hepatosplenomegaly or hernias noted.  No guarding or rebound tenderness.    Msk:  Symmetrical without gross deformities. Good, equal movement & strength bilaterally. Pulses:  Normal pulses noted. Extremities:  No clubbing or edema.  No cyanosis. Neurologic:  Alert and oriented x3;  grossly normal neurologically. Skin:  Intact without significant  lesions or rashes. No jaundice. Lymph Nodes:  No significant cervical adenopathy. Psych:  Alert and cooperative. Normal mood and affect.   Labs: CBC    Component Value Date/Time   WBC 5.1 10/03/2019 0806   RBC 4.90 10/03/2019 0806   HGB 14.7 10/03/2019 0806   HCT 43.8 10/03/2019 0806   PLT 229.0 10/03/2019 0806   MCV 89.5 10/03/2019 0806   MCV 89.5 04/03/2012 0844   MCH 29.2 12/14/2018 1244   MCHC 33.6 10/03/2019 0806   RDW 13.0 10/03/2019 0806   CMP     Component Value Date/Time   NA 135 10/03/2019 0806   K 4.0 10/03/2019 0806   CL 99 10/03/2019 0806   CO2 28 10/03/2019 0806   GLUCOSE 109 (H) 10/03/2019 0806   BUN 11 10/03/2019 0806   CREATININE 0.86 10/03/2019 0806   CREATININE 0.81 04/03/2012 0843   CALCIUM 9.7 10/03/2019 0806   PROT 7.3 10/03/2019 0806   ALBUMIN 4.5 10/03/2019 0806   AST 34 10/03/2019 0806   ALT 40 10/03/2019 0806   ALKPHOS 53 10/03/2019 0806   BILITOT 0.9 10/03/2019 0806   GFRNONAA >60 12/14/2018 1244   GFRAA >60 12/14/2018 1244    Imaging Studies: No results found.  Assessment and Plan:   Arthur Stevens is a 45 y.o. y/o male has been referred for dysphagia  Clinical symptoms suspicious for EOE Other etiologies could include Schatzki's ring Low risk of malignancy  However, EGD indicated for evaluation of above symptoms  Patient is also due for screening colonoscopy  Alternative options of conservative management were discussed in detail, including but not limited to medication management, foregoing endoscopic procedures at this time and others.    I have discussed alternative options, risks & benefits,  which include, but are not limited to, bleeding, infection, perforation,respiratory complication & drug reaction.  The patient agrees with this plan & written consent will be obtained.       Dr Arthur Stevens  Speech recognition software was used to dictate the above note.

## 2020-07-01 ENCOUNTER — Other Ambulatory Visit: Payer: Self-pay

## 2020-07-01 ENCOUNTER — Other Ambulatory Visit
Admission: RE | Admit: 2020-07-01 | Discharge: 2020-07-01 | Disposition: A | Discharge status: Home / Self Care | Payer: BC Managed Care – PPO | Source: Ambulatory Visit | Attending: Gastroenterology | Admitting: Gastroenterology

## 2020-07-01 DIAGNOSIS — K449 Diaphragmatic hernia without obstruction or gangrene: Secondary | ICD-10-CM | POA: Diagnosis not present

## 2020-07-01 DIAGNOSIS — Z01812 Encounter for preprocedural laboratory examination: Secondary | ICD-10-CM | POA: Insufficient documentation

## 2020-07-01 DIAGNOSIS — Z20822 Contact with and (suspected) exposure to covid-19: Secondary | ICD-10-CM | POA: Insufficient documentation

## 2020-07-01 DIAGNOSIS — Z79899 Other long term (current) drug therapy: Secondary | ICD-10-CM | POA: Diagnosis not present

## 2020-07-01 DIAGNOSIS — K2100 Gastro-esophageal reflux disease with esophagitis, without bleeding: Secondary | ICD-10-CM | POA: Diagnosis not present

## 2020-07-01 DIAGNOSIS — K3189 Other diseases of stomach and duodenum: Secondary | ICD-10-CM | POA: Diagnosis not present

## 2020-07-01 DIAGNOSIS — Z8349 Family history of other endocrine, nutritional and metabolic diseases: Secondary | ICD-10-CM | POA: Diagnosis not present

## 2020-07-01 DIAGNOSIS — Z8249 Family history of ischemic heart disease and other diseases of the circulatory system: Secondary | ICD-10-CM | POA: Diagnosis not present

## 2020-07-01 DIAGNOSIS — R1310 Dysphagia, unspecified: Secondary | ICD-10-CM | POA: Diagnosis not present

## 2020-07-01 DIAGNOSIS — K2210 Ulcer of esophagus without bleeding: Secondary | ICD-10-CM | POA: Diagnosis not present

## 2020-07-01 DIAGNOSIS — Z809 Family history of malignant neoplasm, unspecified: Secondary | ICD-10-CM | POA: Diagnosis not present

## 2020-07-01 DIAGNOSIS — Z1211 Encounter for screening for malignant neoplasm of colon: Secondary | ICD-10-CM | POA: Diagnosis not present

## 2020-07-02 ENCOUNTER — Encounter: Payer: Self-pay | Admitting: Gastroenterology

## 2020-07-02 LAB — SARS CORONAVIRUS 2 (TAT 6-24 HRS): SARS Coronavirus 2: NEGATIVE

## 2020-07-03 ENCOUNTER — Ambulatory Visit
Admission: RE | Admit: 2020-07-03 | Discharge: 2020-07-03 | Disposition: A | Discharge status: Home / Self Care | Payer: BC Managed Care – PPO | Attending: Gastroenterology | Admitting: Gastroenterology

## 2020-07-03 ENCOUNTER — Encounter: Payer: Self-pay | Admitting: Gastroenterology

## 2020-07-03 ENCOUNTER — Ambulatory Visit: Payer: BC Managed Care – PPO | Admitting: Anesthesiology

## 2020-07-03 ENCOUNTER — Other Ambulatory Visit: Payer: Self-pay

## 2020-07-03 ENCOUNTER — Encounter
Admission: RE | Disposition: A | Discharge status: Home / Self Care | Payer: Self-pay | Source: Home / Self Care | Attending: Gastroenterology

## 2020-07-03 DIAGNOSIS — Z8349 Family history of other endocrine, nutritional and metabolic diseases: Secondary | ICD-10-CM | POA: Insufficient documentation

## 2020-07-03 DIAGNOSIS — Z809 Family history of malignant neoplasm, unspecified: Secondary | ICD-10-CM | POA: Insufficient documentation

## 2020-07-03 DIAGNOSIS — Z8249 Family history of ischemic heart disease and other diseases of the circulatory system: Secondary | ICD-10-CM | POA: Diagnosis not present

## 2020-07-03 DIAGNOSIS — K449 Diaphragmatic hernia without obstruction or gangrene: Secondary | ICD-10-CM | POA: Insufficient documentation

## 2020-07-03 DIAGNOSIS — Z79899 Other long term (current) drug therapy: Secondary | ICD-10-CM | POA: Diagnosis not present

## 2020-07-03 DIAGNOSIS — K209 Esophagitis, unspecified without bleeding: Secondary | ICD-10-CM

## 2020-07-03 DIAGNOSIS — R1319 Other dysphagia: Secondary | ICD-10-CM | POA: Diagnosis not present

## 2020-07-03 DIAGNOSIS — K2100 Gastro-esophageal reflux disease with esophagitis, without bleeding: Secondary | ICD-10-CM | POA: Diagnosis not present

## 2020-07-03 DIAGNOSIS — K2210 Ulcer of esophagus without bleeding: Secondary | ICD-10-CM | POA: Insufficient documentation

## 2020-07-03 DIAGNOSIS — Z20822 Contact with and (suspected) exposure to covid-19: Secondary | ICD-10-CM | POA: Diagnosis not present

## 2020-07-03 DIAGNOSIS — R1310 Dysphagia, unspecified: Secondary | ICD-10-CM | POA: Insufficient documentation

## 2020-07-03 DIAGNOSIS — Z1211 Encounter for screening for malignant neoplasm of colon: Secondary | ICD-10-CM | POA: Diagnosis not present

## 2020-07-03 DIAGNOSIS — L538 Other specified erythematous conditions: Secondary | ICD-10-CM | POA: Diagnosis not present

## 2020-07-03 DIAGNOSIS — K3189 Other diseases of stomach and duodenum: Secondary | ICD-10-CM | POA: Insufficient documentation

## 2020-07-03 HISTORY — PX: ESOPHAGOGASTRODUODENOSCOPY (EGD) WITH PROPOFOL: SHX5813

## 2020-07-03 HISTORY — PX: COLONOSCOPY WITH PROPOFOL: SHX5780

## 2020-07-03 HISTORY — DX: Anxiety disorder, unspecified: F41.9

## 2020-07-03 HISTORY — DX: Essential (primary) hypertension: I10

## 2020-07-03 SURGERY — COLONOSCOPY WITH PROPOFOL
Anesthesia: General

## 2020-07-03 MED ORDER — PROPOFOL 500 MG/50ML IV EMUL
INTRAVENOUS | Status: DC | PRN
  Administered 2020-07-03: 175 ug/kg/min via INTRAVENOUS

## 2020-07-03 MED ORDER — OMEPRAZOLE 40 MG PO CPDR: DELAYED_RELEASE_CAPSULE | ORAL | 0 refills | Status: DC

## 2020-07-03 MED ORDER — LIDOCAINE HCL (PF) 2 % IJ SOLN
INTRAMUSCULAR | Status: AC
  Filled 2020-07-03: qty 5

## 2020-07-03 MED ORDER — PROPOFOL 500 MG/50ML IV EMUL
INTRAVENOUS | Status: AC
  Filled 2020-07-03: qty 50

## 2020-07-03 MED ORDER — SODIUM CHLORIDE 0.9 % IV SOLN: INTRAVENOUS | Status: DC

## 2020-07-03 MED ORDER — PROPOFOL 10 MG/ML IV BOLUS
INTRAVENOUS | Status: DC | PRN
  Administered 2020-07-03: 20 mg via INTRAVENOUS
  Administered 2020-07-03: 80 mg via INTRAVENOUS
  Administered 2020-07-03: 30 mg via INTRAVENOUS
  Administered 2020-07-03: 20 mg via INTRAVENOUS
  Administered 2020-07-03: 30 mg via INTRAVENOUS

## 2020-07-03 MED ORDER — LIDOCAINE HCL (CARDIAC) PF 100 MG/5ML IV SOSY
PREFILLED_SYRINGE | INTRAVENOUS | Status: DC | PRN
  Administered 2020-07-03: 50 mg via INTRAVENOUS

## 2020-07-03 NOTE — H&P (Signed)
[name], MD [address], [place], [place], [place], [address] [address], [place], [place], [place], [address] Phone: [phone]  Fax: [phone]  Primary Care Physician:  Pleas Koch, NP   Pre-Procedure History & Physical: HPI:  Arthur Stevens is a 45 y.o. male is here for a colonoscopy and EGD.   Past Medical History:  Diagnosis Date  . Allergy   . Anxiety   . Hyperlipidemia   . Hypertension     History reviewed. No pertinent surgical history.  Prior to Admission medications   Medication Sig Start Date End Date Taking? Authorizing Provider  Ascorbic Acid (VITAMIN C) 100 MG tablet Take 100 mg by mouth daily.   Yes [provider]  atorvastatin (LIPITOR) 20 MG tablet TAKE 1 TABLET BY MOUTH EVERY DAY 02/01/20  Yes Pleas Koch, NP  cetirizine (ZYRTEC) 10 MG tablet Take 10 mg by mouth daily.   Yes [provider]  citalopram (CELEXA) 10 MG tablet TAKE 1 TABLET (10 MG TOTAL) BY MOUTH DAILY. FOR ANXIETY. 05/17/20  Yes Pleas Koch, NP  lisinopril (ZESTRIL) 10 MG tablet Take 1 tablet (10 mg total) by mouth daily. For blood pressure 12/18/19  Yes Pleas Koch, NP  Multiple Vitamin (MULTIVITAMIN) tablet Take 1 tablet by mouth daily.   Yes [provider]  zinc gluconate 50 MG tablet Take 50 mg by mouth daily.   Yes [provider]  diphenhydramine-acetaminophen (TYLENOL PM) 25-500 MG TABS Take 1 tablet by mouth at bedtime as needed.    [provider]    Allergies as of 06/27/2020  . (No Known Allergies)    Family History  Problem Relation Age of Onset  . Hyperlipidemia Mother   . Hypertension Mother   . Cancer Sister     Social History   Socioeconomic History  . Marital status: Married    Spouse name: Not on file  . Number of children: Not on file  . Years of education: Not on file  . Highest education level: Not on file  Occupational History  . Not on file  Tobacco Use  .  Smoking status: Never Smoker  . Smokeless tobacco: Never Used  Vaping Use  . Vaping Use: Never used  Substance and Sexual Activity  . Alcohol use: Yes    Comment: 2 x beers per day  . Drug use: No  . Sexual activity: Yes    Birth control/protection: None  Other Topics Concern  . Not on file  Social History Narrative   Married.   2 children.   Works as a Financial risk analyst.   Enjoys spending time with children, playing golf   Social Determinants of Health   Financial Resource Strain: Not on file  Food Insecurity: Not on file  Transportation Needs: Not on file  Physical Activity: Not on file  Stress: Not on file  Social Connections: Not on file  Intimate Partner Violence: Not on file    Review of Systems: See HPI, otherwise negative ROS  Physical Exam: BP 137/84   Pulse 70   Temp (!) 97.4 F (36.3 C) (Temporal)   Resp 16   Ht 6' (1.829 m)   Wt 101.2 kg   SpO2 100%   BMI 30.24 kg/m  General:   Alert,  pleasant and cooperative in NAD Head:  Normocephalic and atraumatic. Neck:  Supple; no masses or thyromegaly. Lungs:  Clear throughout to auscultation, normal respiratory effort.    Heart:  +S1, +S2,  Regular rate and rhythm, No edema. Abdomen:  Soft, nontender and nondistended. Normal bowel sounds, without guarding, and without rebound.   Neurologic:  Alert and  oriented x4;  grossly normal neurologically.  Impression/Plan: Daryll Drown is here for a colonoscopy to be performed for average risk screening and EGD for dysphagia  Risks, benefits, limitations, and alternatives regarding the procedures have been reviewed with the patient.  Questions have been answered.  All parties agreeable.   Virgel Manifold, MD  07/03/2020, 11:16 AM

## 2020-07-03 NOTE — Op Note (Addendum)
[facility] Gastroenterology Patient Name: Arthur Stevens Procedure Date: 07/03/2020 11:14 AM MRN: [id number] Account #: [id number] Date of Birth: 1975/07/15 Admit Type: Outpatient Age: 45 Room: [facility] ENDO ROOM 3 Gender: Male Note Status: Finalized Procedure:             Upper GI endoscopy Indications:           Dysphagia Providers:             Johnatan Baskette B. Bonna Gains MD, MD Medicines:             Monitored Anesthesia Care Complications:         No immediate complications. Procedure:             Pre-Anesthesia Assessment:                        - Prior to the procedure, a History and Physical was                         performed, and patient medications, allergies and                         sensitivities were reviewed. The patient's tolerance                         of previous anesthesia was reviewed.                        - The risks and benefits of the procedure and the                         sedation options and risks were discussed with the                         patient. All questions were answered and informed                         consent was obtained.                        - Patient identification and proposed procedure were                         verified prior to the procedure by the physician, the                         nurse, the anesthesiologist, the anesthetist and the                         technician. The procedure was verified in the                         procedure room.                        - ASA Grade Assessment: II - A patient with mild                         systemic disease.  After obtaining informed consent, the endoscope was                         passed under direct vision. Throughout the procedure,                         the patient's blood pressure, pulse, and oxygen                         saturations were monitored continuously. The Endoscope                         was introduced through the mouth,  and advanced to the                         second part of duodenum. The upper GI endoscopy was                         accomplished with ease. The patient tolerated the                         procedure well. Findings:      LA Grade C (one or more mucosal breaks continuous between tops of 2 or       more mucosal folds, less than 75% circumference) esophagitis with no       bleeding was found in the distal esophagus. Biopsies were obtained from       the proximal and distal esophagus with cold forceps for histology of       suspected eosinophilic esophagitis. Biopsies for EoE were done away from       the area of esophagitis.      One superficial esophageal ulcer with no bleeding and no stigmata of       recent bleeding was found in the distal esophagus. The lesion was 5 mm       in largest dimension.      Localized mild mucosal changes characterized by nodularity were found in       the distal esophagus. Biopsies were taken with a cold forceps for       histology.      The exam of the esophagus was otherwise normal.      Patchy mildly erythematous mucosa without bleeding was found in the       gastric antrum. Biopsies were taken with a cold forceps for histology.       Biopsies were obtained in the gastric body, at the incisura and in the       gastric antrum with cold forceps for histology.      A 2 cm hiatal hernia was present.      The exam of the stomach was otherwise normal.      Patchy mildly erythematous mucosa without active bleeding and with no       stigmata of bleeding was found in the duodenal bulb.      The exam of the duodenum was otherwise normal. Impression:            - LA Grade C reflux esophagitis with no bleeding.                         Biopsied.                        -  Esophageal ulcer with no bleeding and no stigmata of                         recent bleeding.                        - Nodular mucosa in the esophagus. Biopsied.                        -  Erythematous mucosa in the antrum. Biopsied.                        - 2 cm hiatal hernia.                        - Erythematous duodenopathy.                        - Biopsies were obtained in the gastric body, at the                         incisura and in the gastric antrum. Recommendation:        - Await pathology results.                        - Discharge patient to home (with escort).                        - Advance diet as tolerated.                        - Continue present medications.                        - Patient has a contact number available for                         emergencies. The signs and symptoms of potential                         delayed complications were discussed with the patient.                         Return to normal activities tomorrow. Written                         discharge instructions were provided to the patient.                        - Discharge patient to home (with escort).                        - The findings and recommendations were discussed with                         the patient.                        - The findings and recommendations were discussed with  the patient's family.                        - Follow an antireflux regimen.                        - Take prescribed proton pump inhibitor or H2 blocker                         (antacid) medications 30 - 60 minutes before meals. Procedure Code(s):     --- Professional ---                        [phone], Esophagogastroduodenoscopy, flexible,                         transoral; with biopsy, single or multiple Diagnosis Code(s):     --- Professional ---                        K21.00, Gastro-esophageal reflux disease with                         esophagitis, without bleeding                        K22.10, Ulcer of esophagus without bleeding                        K22.8, Other specified diseases of esophagus                        K31.89, Other diseases of stomach  and duodenum                        K44.9, Diaphragmatic hernia without obstruction or                         gangrene                        R13.10, Dysphagia, unspecified CPT copyright 2019 American Medical Association. All rights reserved. The codes documented in this report are preliminary and upon coder review may  be revised to meet current compliance requirements.  [name], MD Margretta Sidle B. Bonna Gains MD, MD 07/03/2020 11:36:45 AM This report has been signed electronically. Number of Addenda: 0 Note Initiated On: 07/03/2020 11:14 AM      [facility]

## 2020-07-03 NOTE — Op Note (Signed)
[facility] Gastroenterology Patient Name: Arthur Stevens Procedure Date: 07/03/2020 11:14 AM MRN: [id number] Account #: [id number] Date of Birth: [birth year] Admit Type: Outpatient Age: 45 Room: [facility] ENDO ROOM 3 Gender: Male Note Status: Finalized Procedure:             Colonoscopy Indications:           Screening for colorectal malignant neoplasm Providers:             Satoria Dunlop B. Bonna Gains MD, MD Medicines:             Monitored Anesthesia Care Complications:         No immediate complications. Procedure:             Pre-Anesthesia Assessment:                        - Prior to the procedure, a History and Physical was                         performed, and patient medications, allergies and                         sensitivities were reviewed. The patient's tolerance                         of previous anesthesia was reviewed.                        - The risks and benefits of the procedure and the                         sedation options and risks were discussed with the                         patient. All questions were answered and informed                         consent was obtained.                        - Patient identification and proposed procedure were                         verified prior to the procedure by the physician, the                         nurse, the anesthetist and the technician. The                         procedure was verified in the pre-procedure area in                         the procedure room in the endoscopy suite.                        - ASA Grade Assessment: II - A patient with mild                         systemic disease.                        -  After reviewing the risks and benefits, the patient                         was deemed in satisfactory condition to undergo the                         procedure.                        After obtaining informed consent, the colonoscope was                         passed under direct  vision. Throughout the procedure,                         the patient's blood pressure, pulse, and oxygen                         saturations were monitored continuously. The                         Colonoscope was introduced through the anus and                         advanced to the the cecum, identified by appendiceal                         orifice and ileocecal valve. The colonoscopy was                         performed with ease. The patient tolerated the                         procedure well. The quality of the bowel preparation                         was fair. Findings:      The perianal and digital rectal examinations were normal.      The rectum, sigmoid colon, descending colon, transverse colon, ascending       colon and cecum appeared normal.      The retroflexed view of the distal rectum and anal verge was normal and       showed no anal or rectal abnormalities. Impression:            - Preparation of the colon was fair.                        - The rectum, sigmoid colon, descending colon,                         transverse colon, ascending colon and cecum are normal.                        - The distal rectum and anal verge are normal on                         retroflexion view.                        -  No specimens collected. Recommendation:        - Discharge patient to home.                        - Resume previous diet.                        - Continue present medications.                        - Repeat colonoscopy within 6-12 months, with 2 day                         prep, because the bowel preparation was suboptimal.                        - Return to primary care physician as previously                         scheduled.                        - The findings and recommendations were discussed with                         the patient.                        - The findings and recommendations were discussed with                         the patient's  family. Procedure Code(s):     --- Professional ---                        [phone], Colonoscopy, flexible; diagnostic, including                         collection of specimen(s) by brushing or washing, when                         performed (separate procedure) Diagnosis Code(s):     --- Professional ---                        Z12.11, Encounter for screening for malignant neoplasm                         of colon CPT copyright 2019 American Medical Association. All rights reserved. The codes documented in this report are preliminary and upon coder review may  be revised to meet current compliance requirements.  [name], MD Margretta Sidle B. Bonna Gains MD, MD 07/03/2020 11:56:34 AM This report has been signed electronically. Number of Addenda: 0 Note Initiated On: 07/03/2020 11:14 AM Scope Withdrawal Time: 0 hours 8 minutes 40 seconds  Total Procedure Duration: 0 hours 12 minutes 45 seconds       [facility]

## 2020-07-03 NOTE — Anesthesia Procedure Notes (Signed)
Date/Time: 07/03/2020 11:18 AM Performed by: Johnna Acosta, CRNA Pre-anesthesia Checklist: Patient identified, Emergency Drugs available, Suction available, Patient being monitored and Timeout performed Patient Re-evaluated:Patient Re-evaluated prior to induction Oxygen Delivery Method: Nasal cannula Preoxygenation: Pre-oxygenation with 100% oxygen Induction Type: IV induction

## 2020-07-03 NOTE — Transfer of Care (Signed)
Immediate Anesthesia Transfer of Care Note  Patient: Arthur Stevens  Procedure(s) Performed: COLONOSCOPY WITH PROPOFOL (N/A ) ESOPHAGOGASTRODUODENOSCOPY (EGD) WITH PROPOFOL (N/A )  Patient Location: PACU  Anesthesia Type:General  Level of Consciousness: awake and alert   Airway & Oxygen Therapy: Patient Spontanous Breathing  Post-op Assessment: Report given to RN and Post -op Vital signs reviewed and stable  Post vital signs: Reviewed and stable  Last Vitals:  Vitals Value Taken Time  BP 119/75 07/03/20 1200  Temp 37 C 07/03/20 1157  Pulse 80 07/03/20 1200  Resp 24 07/03/20 1200  SpO2 97 % 07/03/20 1200    Last Pain:  Vitals:   07/03/20 1157  TempSrc: Temporal  PainSc: Asleep         Complications: No complications documented.

## 2020-07-03 NOTE — Anesthesia Preprocedure Evaluation (Signed)
Anesthesia Evaluation  Patient identified by MRN, date of birth, ID band Patient awake    Reviewed: Allergy & Precautions, NPO status , Patient's Chart, lab work & pertinent test results  Airway Mallampati: II  TM Distance: >3 FB     Dental  (+) Teeth Intact   Pulmonary neg pulmonary ROS,    Pulmonary exam normal        Cardiovascular hypertension, Normal cardiovascular exam     Neuro/Psych PSYCHIATRIC DISORDERS Anxiety negative neurological ROS     GI/Hepatic negative GI ROS, Neg liver ROS,   Endo/Other  negative endocrine ROS  Renal/GU negative Renal ROS  negative genitourinary   Musculoskeletal negative musculoskeletal ROS (+)   Abdominal Normal abdominal exam  (+)   Peds negative pediatric ROS (+)  Hematology negative hematology ROS (+)   Anesthesia Other Findings Past Medical History: No date: Allergy No date: Anxiety No date: Hyperlipidemia No date: Hypertension  Reproductive/Obstetrics                             Anesthesia Physical Anesthesia Plan  ASA: II  Anesthesia Plan: General   Post-op Pain Management:    Induction: Intravenous  PONV Risk Score and Plan: Propofol infusion  Airway Management Planned: Nasal Cannula  Additional Equipment:   Intra-op Plan:   Post-operative Plan:   Informed Consent: I have reviewed the patients History and Physical, chart, labs and discussed the procedure including the risks, benefits and alternatives for the proposed anesthesia with the patient or authorized representative who has indicated his/her understanding and acceptance.     Dental advisory given  Plan Discussed with: CRNA and Surgeon  Anesthesia Plan Comments:         Anesthesia Quick Evaluation

## 2020-07-04 ENCOUNTER — Encounter: Payer: Self-pay | Admitting: Gastroenterology

## 2020-07-04 LAB — SURGICAL PATHOLOGY

## 2020-07-04 NOTE — Anesthesia Postprocedure Evaluation (Signed)
Anesthesia Post Note  Patient: Helder Crisafulli Abdo  Procedure(s) Performed: COLONOSCOPY WITH PROPOFOL (N/A ) ESOPHAGOGASTRODUODENOSCOPY (EGD) WITH PROPOFOL (N/A )  Patient location during evaluation: Endoscopy Anesthesia Type: General Level of consciousness: awake and alert and oriented Pain management: pain level controlled Vital Signs Assessment: post-procedure vital signs reviewed and stable Respiratory status: spontaneous breathing Cardiovascular status: blood pressure returned to baseline Anesthetic complications: no   No complications documented.   Last Vitals:  Vitals:   07/03/20 1217 07/03/20 1227  BP: (!) 135/95 (!) 148/92  Pulse: 66 61  Resp: 16 (!) 9  Temp:    SpO2: 100% 100%    Last Pain:  Vitals:   07/04/20 0721  TempSrc:   PainSc: 0-No pain                 Khalia Gong

## 2020-07-25 ENCOUNTER — Other Ambulatory Visit: Payer: Self-pay | Admitting: Gastroenterology

## 2020-07-31 ENCOUNTER — Other Ambulatory Visit: Payer: Self-pay | Admitting: Primary Care

## 2020-07-31 DIAGNOSIS — E785 Hyperlipidemia, unspecified: Secondary | ICD-10-CM

## 2020-07-31 NOTE — Telephone Encounter (Signed)
Patient is due for CPE/follow up in July, this will be required prior to any further refills.  Please schedule.

## 2020-08-22 DIAGNOSIS — I10 Essential (primary) hypertension: Secondary | ICD-10-CM

## 2020-08-22 MED ORDER — LISINOPRIL 10 MG PO TABS: 10.0000 mg | ORAL_TABLET | Freq: Every day | ORAL | 0 refills | Status: DC

## 2020-08-29 ENCOUNTER — Other Ambulatory Visit: Payer: Self-pay | Admitting: Gastroenterology

## 2020-09-02 ENCOUNTER — Other Ambulatory Visit: Payer: Self-pay | Admitting: Primary Care

## 2020-09-02 DIAGNOSIS — F411 Generalized anxiety disorder: Secondary | ICD-10-CM

## 2020-09-09 ENCOUNTER — Other Ambulatory Visit: Payer: Self-pay

## 2020-09-09 ENCOUNTER — Encounter: Payer: Self-pay | Admitting: Gastroenterology

## 2020-09-09 ENCOUNTER — Ambulatory Visit (INDEPENDENT_AMBULATORY_CARE_PROVIDER_SITE_OTHER): Payer: BC Managed Care – PPO | Admitting: Gastroenterology

## 2020-09-09 VITALS — BP 150/98 | HR 83 | Temp 98.4°F | Wt 231.4 lb

## 2020-09-09 DIAGNOSIS — R1310 Dysphagia, unspecified: Secondary | ICD-10-CM

## 2020-09-09 DIAGNOSIS — K209 Esophagitis, unspecified without bleeding: Secondary | ICD-10-CM

## 2020-09-09 MED ORDER — FAMOTIDINE 20 MG PO TABS: 20.0000 mg | ORAL_TABLET | Freq: Every day | ORAL | 0 refills | Status: DC

## 2020-09-09 MED ORDER — OMEPRAZOLE 40 MG PO CPDR: 40.0000 mg | DELAYED_RELEASE_CAPSULE | Freq: Every day | ORAL | 0 refills | Status: DC

## 2020-09-09 NOTE — Progress Notes (Signed)
Arthur Antigua, MD [address]  [place]  [place], [place] [address]  Main: [phone]  Fax: [phone]   Primary Care Physician: Pleas Koch, NP   Chief Complaint  Patient presents with  . Follow-up    Pt denies any changes or concerns    HPI: Arthur Stevens is a 45 y.o. male here for follow-up of dysphagia.  Patient underwent upper endoscopy that showed grade C esophagitis and he was placed on twice daily PPI for 30 days which she has decreased to once daily as instructed after 30 days.  Patient reports complete relief resolution of dysphagia since then.  No heartburn.  States he never had symptoms of heartburn or reflux and his only symptom was that dysphagia has resolved after treatment with PPI.  Biopsies were negative for EOE.  No abdominal pain, nausea or vomiting, no weight loss.  No blood in stool.  Current Outpatient Medications  Medication Sig Dispense Refill  . Ascorbic Acid (VITAMIN C) 100 MG tablet Take 100 mg by mouth daily.    Marland Kitchen atorvastatin (LIPITOR) 20 MG tablet Take 1 tablet (20 mg total) by mouth daily. For cholesterol. 90 tablet 0  . cetirizine (ZYRTEC) 10 MG tablet Take 10 mg by mouth daily.    . citalopram (CELEXA) 10 MG tablet TAKE 1 TABLET (10 MG TOTAL) BY MOUTH DAILY. FOR ANXIETY. 90 tablet 1  . diphenhydramine-acetaminophen (TYLENOL PM) 25-500 MG TABS Take 1 tablet by mouth at bedtime as needed.    Derrill Memo ON 11/04/2020] famotidine (PEPCID) 20 MG tablet Take 1 tablet (20 mg total) by mouth daily. Start after completion of PPI therapy as discussed 30 tablet 0  . lisinopril (ZESTRIL) 10 MG tablet Take 1 tablet (10 mg total) by mouth daily. For blood pressure 90 tablet 3  . lisinopril (ZESTRIL) 10 MG tablet Take 1 tablet (10 mg total) by mouth daily. For blood pressure. 30 tablet 0  . Multiple Vitamin (MULTIVITAMIN) tablet Take 1 tablet by mouth daily.    Marland Kitchen zinc gluconate 50 MG tablet Take 50 mg by mouth daily.    Marland Kitchen omeprazole (PRILOSEC)  40 MG capsule Take 1 capsule (40 mg total) by mouth daily. 56 capsule 0   No current facility-administered medications for this visit.    Allergies as of 09/09/2020  . (No Known Allergies)    ROS:  General: Negative for anorexia, weight loss, fever, chills, fatigue, weakness. ENT: Negative for hoarseness, difficulty swallowing , nasal congestion. CV: Negative for chest pain, angina, palpitations, dyspnea on exertion, peripheral edema.  Respiratory: Negative for dyspnea at rest, dyspnea on exertion, cough, sputum, wheezing.  GI: See history of present illness. GU:  Negative for dysuria, hematuria, urinary incontinence, urinary frequency, nocturnal urination.  Endo: Negative for unusual weight change.    Physical Examination:   BP (!) 150/98   Pulse 83   Temp 98.4 F (36.9 C) (Oral)   Wt 231 lb 6.4 oz (105 kg)   BMI 31.38 kg/m   General: Well-nourished, well-developed in no acute distress.  Eyes: No icterus. Conjunctivae pink. Mouth: Oropharyngeal mucosa moist and pink , no lesions erythema or exudate. Neck: Supple, Trachea midline Abdomen: Bowel sounds are normal, nontender, nondistended, no hepatosplenomegaly or masses, no abdominal bruits or hernia , no rebound or guarding.   Extremities: No lower extremity edema. No clubbing or deformities. Neuro: Alert and oriented x 3.  Grossly intact. Skin: Warm and dry, no jaundice.   Psych: Alert and cooperative, normal  mood and affect.   Labs: CMP     Component Value Date/Time   NA 135 10/03/2019 0806   K 4.0 10/03/2019 0806   CL 99 10/03/2019 0806   CO2 28 10/03/2019 0806   GLUCOSE 109 (H) 10/03/2019 0806   BUN 11 10/03/2019 0806   CREATININE 0.86 10/03/2019 0806   CREATININE 0.81 04/03/2012 0843   CALCIUM 9.7 10/03/2019 0806   PROT 7.3 10/03/2019 0806   ALBUMIN 4.5 10/03/2019 0806   AST 34 10/03/2019 0806   ALT 40 10/03/2019 0806   ALKPHOS 53 10/03/2019 0806   BILITOT 0.9 10/03/2019 0806   GFRNONAA >60 12/14/2018  1244   GFRAA >60 12/14/2018 1244   Lab Results  Component Value Date   WBC 5.1 10/03/2019   HGB 14.7 10/03/2019   HCT 43.8 10/03/2019   MCV 89.5 10/03/2019   PLT 229.0 10/03/2019    Imaging Studies: No results found.  Assessment and Plan:   Arthur Stevens is a 45 y.o. y/o male here for follow-up of dysphagia and grade C esophagitis  Symptoms of dysphagia have completely resolved with treatment with PPI  Patient has completed 30 days of twice daily PPI and is currently on once daily PPI.  Given severity of his esophagitis and severity of symptoms which included dysphagia and not so much heartburn, would recommend at least 2 to 2 more months of treatment with once daily PPI and if symptoms remain well controlled, can start H2 RA therapy.  It is important to note that patient never had symptoms of reflux, denied any heartburn prior to his onset of dysphagia either and his presenting symptom was dysphagia.  Therefore, by the time acid reflux caused any symptoms, it ended up being esophagitis causing his dysphagia about rather than the usual presenting complaint of heartburn.  Therefore, H2 RA therapy after his PPI is completed would benefit in this setting to prevent severe esophagitis again given that he usually does not feel the heartburn before he has a worse symptom of dysphagia  We did discuss need for repeat upper endoscopy after few months to ensure grade C esophagitis has resolved, and also that there is no Barrett's present after this heals.  If his symptoms continue, consider referral for hiatal hernia surgery  Repeat colonoscopy also indicated in 6 to 12 months due to suboptimal prep and this was discussed with him as well  Patient educated extensively on acid reflux lifestyle modification, including buying a bed wedge, not eating 3 hrs before bedtime, diet modifications, and handout given for the same.   (Risks of PPI use were discussed with patient including bone loss, C.  Diff diarrhea, pneumonia, infections, CKD, electrolyte abnormalities.  Pt. Verbalizes understanding and chooses to continue the medication.)     Dr Arthur Stevens

## 2020-09-09 NOTE — Patient Instructions (Signed)
[employer] Current Therapy 2021 (pp. 213-216). [place], [place]: Elsevier.">  Gastroesophageal Reflux Disease, Adult Gastroesophageal reflux (GER) happens when acid from the stomach flows up into the tube that connects the mouth and the stomach (esophagus). Normally, food travels down the esophagus and stays in the stomach to be digested. However, when a person has GER, food and stomach acid sometimes move back up into the esophagus. If this becomes a more serious problem, the person may be diagnosed with a disease called gastroesophageal reflux disease (GERD). GERD occurs when the reflux:  Happens often.  Causes frequent or severe symptoms.  Causes problems such as damage to the esophagus. When stomach acid comes in contact with the esophagus, the acid may cause inflammation in the esophagus. Over time, GERD may create small holes (ulcers) in the lining of the esophagus. What are the causes? This condition is caused by a problem with the muscle between the esophagus and the stomach (lower esophageal sphincter, or LES). Normally, the LES muscle closes after food passes through the esophagus to the stomach. When the LES is weakened or abnormal, it does not close properly, and that allows food and stomach acid to go back up into the esophagus. The LES can be weakened by certain dietary substances, medicines, and medical conditions, including:  Tobacco use.  Pregnancy.  Having a hiatal hernia.  Alcohol use.  Certain foods and beverages, such as coffee, chocolate, onions, and peppermint. What increases the risk? You are more likely to develop this condition if you:  Have an increased body weight.  Have a connective tissue disorder.  Take NSAIDs, such as ibuprofen. What are the signs or symptoms? Symptoms of this condition include:  Heartburn.  Difficult or painful swallowing and the feeling of having a lump in the throat.  A bitter taste in the mouth.  Bad breath and having a large  amount of saliva.  Having an upset or bloated stomach and belching.  Chest pain. Different conditions can cause chest pain. Make sure you see your health care provider if you experience chest pain.  Shortness of breath or wheezing.  Ongoing (chronic) cough or a nighttime cough.  Wearing away of tooth enamel.  Weight loss. How is this diagnosed? This condition may be diagnosed based on a medical history and a physical exam. To determine if you have mild or severe GERD, your health care provider may also monitor how you respond to treatment. You may also have tests, including:  A test to examine your stomach and esophagus with a small camera (endoscopy).  A test that measures the acidity level in your esophagus.  A test that measures how much pressure is on your esophagus.  A barium swallow or modified barium swallow test to show the shape, size, and functioning of your esophagus. How is this treated? Treatment for this condition may vary depending on how severe your symptoms are. Your health care provider may recommend:  Changes to your diet.  Medicine.  Surgery. The goal of treatment is to help relieve your symptoms and to prevent complications. Follow these instructions at home: Eating and drinking  Follow a diet as recommended by your health care provider. This may involve avoiding foods and drinks such as: ? Coffee and tea, with or without caffeine. ? Drinks that contain alcohol. ? Energy drinks and sports drinks. ? Carbonated drinks or sodas. ? Chocolate and cocoa. ? Peppermint and mint flavorings. ? Garlic and onions. ? Horseradish. ? Spicy and acidic foods, including peppers, chili powder,   curry powder, vinegar, hot sauces, and barbecue sauce. ? Citrus fruit juices and citrus fruits, such as oranges, lemons, and limes. ? Tomato-based foods, such as red sauce, chili, salsa, and pizza with red sauce. ? Fried and fatty foods, such as donuts, french fries, potato  chips, and high-fat dressings. ? High-fat meats, such as hot dogs and fatty cuts of red and white meats, such as rib eye steak, sausage, ham, and bacon. ? High-fat dairy items, such as whole milk, butter, and cream cheese.  Eat small, frequent meals instead of large meals.  Avoid drinking large amounts of liquid with your meals.  Avoid eating meals during the 2-3 hours before bedtime.  Avoid lying down right after you eat.  Do not exercise right after you eat.   Lifestyle  Do not use any products that contain nicotine or tobacco. These products include cigarettes, chewing tobacco, and vaping devices, such as e-cigarettes. If you need help quitting, ask your health care provider.  Try to reduce your stress by using methods such as yoga or meditation. If you need help reducing stress, ask your health care provider.  If you are overweight, reduce your weight to an amount that is healthy for you. Ask your health care provider for guidance about a safe weight loss goal.   General instructions  Pay attention to any changes in your symptoms.  Take over-the-counter and prescription medicines only as told by your health care provider. Do not take aspirin, ibuprofen, or other NSAIDs unless your health care provider told you to take these medicines.  Wear loose-fitting clothing. Do not wear anything tight around your waist that causes pressure on your abdomen.  Raise (elevate) the head of your bed about 6 inches (15 cm). You can use a wedge to do this.  Avoid bending over if this makes your symptoms worse.  Keep all follow-up visits. This is important. Contact a health care provider if:  You have: ? New symptoms. ? Unexplained weight loss. ? Difficulty swallowing or it hurts to swallow. ? Wheezing or a persistent cough. ? A hoarse voice.  Your symptoms do not improve with treatment. Get help right away if:  You have sudden pain in your arms, neck, jaw, teeth, or back.  You  suddenly feel sweaty, dizzy, or light-headed.  You have chest pain or shortness of breath.  You vomit and the vomit is green, yellow, or black, or it looks like blood or coffee grounds.  You faint.  You have stool that is red, bloody, or black.  You cannot swallow, drink, or eat. These symptoms may represent a serious problem that is an emergency. Do not wait to see if the symptoms will go away. Get medical help right away. Call your local emergency services (911 in the [place] Do not drive yourself to the hospital. Summary  Gastroesophageal reflux happens when acid from the stomach flows up into the esophagus. GERD is a disease in which the reflux happens often, causes frequent or severe symptoms, or causes problems such as damage to the esophagus.  Treatment for this condition may vary depending on how severe your symptoms are. Your health care provider may recommend diet and lifestyle changes, medicine, or surgery.  Contact a health care provider if you have new or worsening symptoms.  Take over-the-counter and prescription medicines only as told by your health care provider. Do not take aspirin, ibuprofen, or other NSAIDs unless your health care provider told you to do so.  Keep all follow-up   visits as told by your health care provider. This is important. This information is not intended to replace advice given to you by your health care provider. Make sure you discuss any questions you have with your health care provider. Document Revised: 10/02/2019 Document Reviewed: 10/02/2019 Elsevier Patient Education  2021 [employer] Inc.  

## 2020-09-15 ENCOUNTER — Other Ambulatory Visit: Payer: Self-pay | Admitting: Primary Care

## 2020-09-15 DIAGNOSIS — I10 Essential (primary) hypertension: Secondary | ICD-10-CM

## 2020-09-15 NOTE — Telephone Encounter (Signed)
Looks like he's due for CPE with labs, can we get him scheduled for July?

## 2020-09-17 NOTE — Telephone Encounter (Signed)
LVM to call back.

## 2020-09-17 NOTE — Telephone Encounter (Signed)
LVM for patient to call back. ?

## 2020-09-18 NOTE — Telephone Encounter (Signed)
LVM to schedule appointment.

## 2020-09-24 DIAGNOSIS — I10 Essential (primary) hypertension: Secondary | ICD-10-CM

## 2020-09-24 NOTE — Telephone Encounter (Signed)
Patient called appointment made will need refill today

## 2020-09-25 MED ORDER — LISINOPRIL 10 MG PO TABS: 10.0000 mg | ORAL_TABLET | Freq: Every day | ORAL | 3 refills | Status: DC

## 2020-10-01 ENCOUNTER — Other Ambulatory Visit: Payer: Self-pay | Admitting: Primary Care

## 2020-10-01 DIAGNOSIS — F411 Generalized anxiety disorder: Secondary | ICD-10-CM

## 2020-10-03 ENCOUNTER — Encounter: Payer: Self-pay | Admitting: Primary Care

## 2020-10-03 ENCOUNTER — Ambulatory Visit: Payer: BC Managed Care – PPO | Admitting: Primary Care

## 2020-10-03 ENCOUNTER — Other Ambulatory Visit: Payer: Self-pay

## 2020-10-03 VITALS — BP 136/88 | HR 79 | Temp 98.4°F | Ht 72.0 in | Wt 230.0 lb

## 2020-10-03 DIAGNOSIS — R1319 Other dysphagia: Secondary | ICD-10-CM | POA: Diagnosis not present

## 2020-10-03 DIAGNOSIS — I10 Essential (primary) hypertension: Secondary | ICD-10-CM | POA: Diagnosis not present

## 2020-10-03 DIAGNOSIS — E785 Hyperlipidemia, unspecified: Secondary | ICD-10-CM | POA: Diagnosis not present

## 2020-10-03 DIAGNOSIS — Z0000 Encounter for general adult medical examination without abnormal findings: Secondary | ICD-10-CM | POA: Diagnosis not present

## 2020-10-03 DIAGNOSIS — R7303 Prediabetes: Secondary | ICD-10-CM | POA: Diagnosis not present

## 2020-10-03 DIAGNOSIS — F411 Generalized anxiety disorder: Secondary | ICD-10-CM

## 2020-10-03 LAB — CBC
HCT: 44.2 % (ref 39.0–52.0)
Hemoglobin: 14.9 g/dL (ref 13.0–17.0)
MCHC: 33.6 g/dL (ref 30.0–36.0)
MCV: 87.4 fl (ref 78.0–100.0)
Platelets: 231 10*3/uL (ref 150.0–400.0)
RBC: 5.06 Mil/uL (ref 4.22–5.81)
RDW: 13.2 % (ref 11.5–15.5)
WBC: 4.5 10*3/uL (ref 4.0–10.5)

## 2020-10-03 LAB — LIPID PANEL
Cholesterol: 220 mg/dL — ABNORMAL HIGH (ref 0–200)
HDL: 59.1 mg/dL (ref >39.00)
NonHDL: 160.99
Total CHOL/HDL Ratio: 4
Triglycerides: 208 mg/dL — ABNORMAL HIGH (ref 0.0–149.0)
VLDL: 41.6 mg/dL — ABNORMAL HIGH (ref 0.0–40.0)

## 2020-10-03 LAB — COMPREHENSIVE METABOLIC PANEL
ALT: 48 U/L (ref 0–53)
AST: 28 U/L (ref 0–37)
Albumin: 4.5 g/dL (ref 3.5–5.2)
Alkaline Phosphatase: 48 U/L (ref 39–117)
BUN: 10 mg/dL (ref 6–23)
CO2: 27 mEq/L (ref 19–32)
Calcium: 9.4 mg/dL (ref 8.4–10.5)
Chloride: 101 mEq/L (ref 96–112)
Creatinine, Ser: 0.96 mg/dL (ref 0.40–1.50)
GFR: 94.8 mL/min (ref >60.00)
Glucose, Bld: 95 mg/dL (ref 70–99)
Potassium: 4.2 mEq/L (ref 3.5–5.1)
Sodium: 136 mEq/L (ref 135–145)
Total Bilirubin: 0.5 mg/dL (ref 0.2–1.2)
Total Protein: 7.6 g/dL (ref 6.0–8.3)

## 2020-10-03 LAB — HEMOGLOBIN A1C: Hgb A1c MFr Bld: 6 % (ref 4.6–6.5)

## 2020-10-03 LAB — LDL CHOLESTEROL, DIRECT: Direct LDL: 135 mg/dL

## 2020-10-03 NOTE — Progress Notes (Signed)
Subjective:    Patient ID: Arthur Stevens, male    DOB: 1975-07-17, 45 y.o.   MRN: [id number]  HPI  Arthur Stevens is a very pleasant 45 y.o. male who presents today for complete physical.  Immunizations: -Tetanus: 2015 -Influenza: Due this season  -Covid-19: 2 vaccines   Diet: Fair diet.  Exercise: No regular exercise.  Eye exam: Completes annually  Dental exam: Completes semi-annually   Colonoscopy: Completed in March 2022, due September 2022 or March 2023 due to sub-optimal prep.    BP Readings from Last 3 Encounters:  10/03/20 136/88  09/09/20 (!) 150/98  07/03/20 (!) 148/92      Review of Systems  Constitutional:  Negative for unexpected weight change.  HENT:  Negative for rhinorrhea.   Respiratory:  Negative for cough and shortness of breath.   Cardiovascular:  Negative for chest pain.  Gastrointestinal:  Negative for constipation and diarrhea.  Genitourinary:  Negative for difficulty urinating.  Musculoskeletal:  Negative for arthralgias and myalgias.  Skin:  Negative for rash.  Allergic/Immunologic: Negative for environmental allergies.  Neurological:  Negative for dizziness and headaches.  Psychiatric/Behavioral:  The patient is not nervous/anxious.         Past Medical History:  Diagnosis Date   Allergy    Anxiety    Hyperlipidemia    Hypertension     Social History   Socioeconomic History   Marital status: Married    Spouse name: Not on file   Number of children: Not on file   Years of education: Not on file   Highest education level: Not on file  Occupational History   Not on file  Tobacco Use   Smoking status: Never   Smokeless tobacco: Never  Vaping Use   Vaping Use: Never used  Substance and Sexual Activity   Alcohol use: Yes    Comment: 2 x beers per day   Drug use: No   Sexual activity: Yes    Birth control/protection: None  Other Topics Concern   Not on file  Social History Narrative   Married.   2 children.   Works as  a Financial risk analyst.   Enjoys spending time with children, playing golf   Social Determinants of Health   Financial Resource Strain: Not on file  Food Insecurity: Not on file  Transportation Needs: Not on file  Physical Activity: Not on file  Stress: Not on file  Social Connections: Not on file  Intimate Partner Violence: Not on file    Past Surgical History:  Procedure Laterality Date   COLONOSCOPY WITH PROPOFOL N/A 07/03/2020   Procedure: COLONOSCOPY WITH PROPOFOL;  Surgeon: Virgel Manifold, MD;  Location: [facility] ENDOSCOPY;  Service: Endoscopy;  Laterality: N/A;   ESOPHAGOGASTRODUODENOSCOPY (EGD) WITH PROPOFOL N/A 07/03/2020   Procedure: ESOPHAGOGASTRODUODENOSCOPY (EGD) WITH PROPOFOL;  Surgeon: Virgel Manifold, MD;  Location: [facility] ENDOSCOPY;  Service: Endoscopy;  Laterality: N/A;    Family History  Problem Relation Age of Onset   Hyperlipidemia Mother    Hypertension Mother    Cancer Sister     No Known Allergies  Current Outpatient Medications on File Prior to Visit  Medication Sig Dispense Refill   Ascorbic Acid (VITAMIN C) 100 MG tablet Take 100 mg by mouth daily.     atorvastatin (LIPITOR) 20 MG tablet Take 1 tablet (20 mg total) by mouth daily. For cholesterol. 90 tablet 0   cetirizine (ZYRTEC) 10 MG tablet Take 10 mg by mouth daily.  citalopram (CELEXA) 10 MG tablet TAKE 1 TABLET (10 MG TOTAL) BY MOUTH DAILY. FOR ANXIETY. 90 tablet 0   diphenhydramine-acetaminophen (TYLENOL PM) 25-500 MG TABS Take 1 tablet by mouth at bedtime as needed.     lisinopril (ZESTRIL) 10 MG tablet Take 1 tablet (10 mg total) by mouth daily. For blood pressure 90 tablet 3   Multiple Vitamin (MULTIVITAMIN) tablet Take 1 tablet by mouth daily.     omeprazole (PRILOSEC) 40 MG capsule Take 1 capsule (40 mg total) by mouth daily. 56 capsule 0   zinc gluconate 50 MG tablet Take 50 mg by mouth daily.     [START ON 11/04/2020] famotidine (PEPCID) 20 MG tablet Take 1 tablet (20 mg total) by mouth  daily. Start after completion of PPI therapy as discussed (Patient not taking: Reported on 10/03/2020) 30 tablet 0   No current facility-administered medications on file prior to visit.    BP 136/88   Pulse 79   Temp 98.4 F (36.9 C) (Temporal)   Ht 6' (1.829 m)   Wt 230 lb (104.3 kg)   SpO2 97%   BMI 31.19 kg/m  Objective:   Physical Exam HENT:     Right Ear: Tympanic membrane and ear canal normal.     Left Ear: Tympanic membrane and ear canal normal.     Nose: Nose normal.     Right Sinus: No maxillary sinus tenderness or frontal sinus tenderness.     Left Sinus: No maxillary sinus tenderness or frontal sinus tenderness.  Eyes:     Conjunctiva/sclera: Conjunctivae normal.  Neck:     Thyroid: No thyromegaly.     Vascular: No carotid bruit.  Cardiovascular:     Rate and Rhythm: Normal rate and regular rhythm.     Heart sounds: Normal heart sounds.  Pulmonary:     Effort: Pulmonary effort is normal.     Breath sounds: Normal breath sounds. No wheezing or rales.  Abdominal:     General: Bowel sounds are normal.     Palpations: Abdomen is soft.     Tenderness: There is no abdominal tenderness.  Musculoskeletal:        General: Normal range of motion.     Cervical back: Neck supple.  Skin:    General: Skin is warm and dry.  Neurological:     Mental Status: He is alert and oriented to person, place, and time.     Cranial Nerves: No cranial nerve deficit.     Deep Tendon Reflexes: Reflexes are normal and symmetric.  Psychiatric:        Mood and Affect: Mood normal.          Assessment & Plan:      This visit occurred during the SARS-CoV-2 public health emergency.  Safety protocols were in place, including screening questions prior to the visit, additional usage of staff PPE, and extensive cleaning of exam room while observing appropriate contact time as indicated for disinfecting solutions.

## 2020-10-03 NOTE — Patient Instructions (Signed)
Stop by the lab prior to leaving today. I will notify you of your results once received.   Monitor your blood pressure a few times weekly. It should run less than 130/90.  It was a pleasure to see you today!  Preventive Care 10-47 Years Old, Male Preventive care refers to lifestyle choices and visits with your health care provider that can promote health and wellness. This includes: A yearly physical exam. This is also called an annual wellness visit. Regular dental and eye exams. Immunizations. Screening for certain conditions. Healthy lifestyle choices, such as: Eating a healthy diet. Getting regular exercise. Not using drugs or products that contain nicotine and tobacco. Limiting alcohol use. What can I expect for my preventive care visit? Physical exam Your health care provider will check your: Height and weight. These may be used to calculate your BMI (body mass index). BMI is a measurement that tells if you are at a healthy weight. Heart rate and blood pressure. Body temperature. Skin for abnormal spots. Counseling Your health care provider may ask you questions about your: Past medical problems. Family's medical history. Alcohol, tobacco, and drug use. Emotional well-being. Home life and relationship well-being. Sexual activity. Diet, exercise, and sleep habits. Work and work Statistician. Access to firearms. What immunizations do I need?  Vaccines are usually given at various ages, according to a schedule. Your health care provider will recommend vaccines for you based on your age, medicalhistory, and lifestyle or other factors, such as travel or where you work. What tests do I need? Blood tests Lipid and cholesterol levels. These may be checked every 5 years, or more often if you are over 4 years old. Hepatitis C test. Hepatitis B test. Screening Lung cancer screening. You may have this screening every year starting at age 24 if you have a 30-pack-year history of  smoking and currently smoke or have quit within the past 15 years. Prostate cancer screening. Recommendations will vary depending on your family history and other risks. Genital exam to check for testicular cancer or hernias. Colorectal cancer screening. All adults should have this screening starting at age 67 and continuing until age 20. Your health care provider may recommend screening at age 40 if you are at increased risk. You will have tests every 1-10 years, depending on your results and the type of screening test. Diabetes screening. This is done by checking your blood sugar (glucose) after you have not eaten for a while (fasting). You may have this done every 1-3 years. STD (sexually transmitted disease) testing, if you are at risk. Follow these instructions at home: Eating and drinking  Eat a diet that includes fresh fruits and vegetables, whole grains, lean protein, and low-fat dairy products. Take vitamin and mineral supplements as recommended by your health care provider. Do not drink alcohol if your health care provider tells you not to drink. If you drink alcohol: Limit how much you have to 0-2 drinks a day. Be aware of how much alcohol is in your drink. In the [place] one drink equals one 12 oz bottle of beer (355 mL), one 5 oz glass of wine (148 mL), or one 1 oz glass of hard liquor (44 mL).  Lifestyle Take daily care of your teeth and gums. Brush your teeth every morning and night with fluoride toothpaste. Floss one time each day. Stay active. Exercise for at least 30 minutes 5 or more days each week. Do not use any products that contain nicotine or tobacco, such as cigarettes,  e-cigarettes, and chewing tobacco. If you need help quitting, ask your health care provider. Do not use drugs. If you are sexually active, practice safe sex. Use a condom or other form of protection to prevent STIs (sexually transmitted infections). If told by your health care provider, take low-dose  aspirin daily starting at age 35. Find healthy ways to cope with stress, such as: Meditation, yoga, or listening to music. Journaling. Talking to a trusted person. Spending time with friends and family. Safety Always wear your seat belt while driving or riding in a vehicle. Do not drive: If you have been drinking alcohol. Do not ride with someone who has been drinking. When you are tired or distracted. While texting. Wear a helmet and other protective equipment during sports activities. If you have firearms in your house, make sure you follow all gun safety procedures. What's next? Go to your health care provider once a year for an annual wellness visit. Ask your health care provider how often you should have your eyes and teeth checked. Stay up to date on all vaccines. This information is not intended to replace advice given to you by your health care provider. Make sure you discuss any questions you have with your healthcare provider. Document Revised: 12/20/2018 Document Reviewed: 03/17/2018 Elsevier Patient Education  2022 [employer].

## 2020-10-03 NOTE — Assessment & Plan Note (Signed)
Borderline high today, is not checking BP at home. Discussed to check a few times weekly and to notify if readings are consistently at or above 130/90.  CMP pending. Continue lisinopril 10 mg.

## 2020-10-03 NOTE — Assessment & Plan Note (Signed)
Doing well on citalopram 10 mg, continue same.

## 2020-10-03 NOTE — Assessment & Plan Note (Signed)
Immunizations UTD. Colonoscopy due later this year, following with GI.  Discussed the importance of a healthy diet and regular exercise in order for weight loss, and to reduce the risk of further co-morbidity.  Exam today stable.  Labs pending.

## 2020-10-03 NOTE — Assessment & Plan Note (Signed)
Improved on omeprazole 40 mg, will be weaning down to famotidine 20 mg soon. Following with GI.

## 2020-10-03 NOTE — Assessment & Plan Note (Signed)
Repeat A1C pending. 

## 2020-10-03 NOTE — Assessment & Plan Note (Signed)
Compliant to atorvastatin 20 mg, repeat lipid panel pending.

## 2020-10-09 ENCOUNTER — Other Ambulatory Visit: Payer: Self-pay | Admitting: Gastroenterology

## 2020-10-29 ENCOUNTER — Other Ambulatory Visit: Payer: Self-pay | Admitting: Primary Care

## 2020-10-29 DIAGNOSIS — E785 Hyperlipidemia, unspecified: Secondary | ICD-10-CM

## 2020-11-06 ENCOUNTER — Other Ambulatory Visit: Payer: Self-pay | Admitting: Gastroenterology

## 2020-11-12 DIAGNOSIS — Z3009 Encounter for other general counseling and advice on contraception: Secondary | ICD-10-CM

## 2020-11-12 NOTE — Telephone Encounter (Signed)
Do not see note or referral in chart. Do you need to see in office or ok to start referral?

## 2020-12-18 ENCOUNTER — Other Ambulatory Visit: Payer: Self-pay

## 2020-12-18 ENCOUNTER — Ambulatory Visit (INDEPENDENT_AMBULATORY_CARE_PROVIDER_SITE_OTHER): Payer: BC Managed Care – PPO | Admitting: Gastroenterology

## 2020-12-18 VITALS — BP 126/89 | HR 65 | Temp 98.4°F | Ht 72.0 in | Wt 220.0 lb

## 2020-12-18 DIAGNOSIS — K209 Esophagitis, unspecified without bleeding: Secondary | ICD-10-CM | POA: Diagnosis not present

## 2020-12-18 DIAGNOSIS — K2100 Gastro-esophageal reflux disease with esophagitis, without bleeding: Secondary | ICD-10-CM

## 2020-12-18 MED ORDER — FAMOTIDINE 20 MG PO TABS: 20.0000 mg | ORAL_TABLET | Freq: Every day | ORAL | 0 refills | Status: DC

## 2020-12-18 NOTE — Progress Notes (Signed)
Arthur Antigua, MD [address]  [place]  [place], [place] [address]  Main: [phone]  Fax: [phone]   Primary Care Physician: Pleas Koch, NP   Chief complaint: Dysphagia  HPI: DAVARIOUS BRAZ is a 45 y.o. male here for follow-up of esophagitis and dysphagia.  Patient is taking Pepcid once a day.  Patient also describes eating healthier, exercising and has lost weight intentionally due to this.  Denies any further dysphagia.  Is eating well.  No heartburn.  No nausea or vomiting.  Denies abdominal pain.  Reports 1 soft bowel movement a day, with no blood.   ROS: All ROS reviewed and negative except as per HPI   Past Medical History:  Diagnosis Date   Allergy    Anxiety    Hyperlipidemia    Hypertension     Past Surgical History:  Procedure Laterality Date   COLONOSCOPY WITH PROPOFOL N/A 07/03/2020   Procedure: COLONOSCOPY WITH PROPOFOL;  Surgeon: Arthur Manifold, MD;  Location: [facility] ENDOSCOPY;  Service: Endoscopy;  Laterality: N/A;   ESOPHAGOGASTRODUODENOSCOPY (EGD) WITH PROPOFOL N/A 07/03/2020   Procedure: ESOPHAGOGASTRODUODENOSCOPY (EGD) WITH PROPOFOL;  Surgeon: Arthur Manifold, MD;  Location: [facility] ENDOSCOPY;  Service: Endoscopy;  Laterality: N/A;    Prior to Admission medications   Medication Sig Start Date End Date Taking? Authorizing Provider  Ascorbic Acid (VITAMIN C) 100 MG tablet Take 100 mg by mouth daily.   Yes [provider]  atorvastatin (LIPITOR) 20 MG tablet TAKE 1 TABLET BY MOUTH DAILY. FOR CHOLESTEROL 10/29/20  Yes Pleas Koch, NP  cetirizine (ZYRTEC) 10 MG tablet Take 10 mg by mouth daily.   Yes [provider]  citalopram (CELEXA) 10 MG tablet TAKE 1 TABLET (10 MG TOTAL) BY MOUTH DAILY. FOR ANXIETY. 10/01/20  Yes Pleas Koch, NP  diphenhydramine-acetaminophen (TYLENOL PM) 25-500 MG TABS Take 1 tablet by mouth at bedtime as needed.   Yes [provider]  lisinopril (ZESTRIL) 10 MG  tablet Take 1 tablet (10 mg total) by mouth daily. For blood pressure 09/25/20  Yes Pleas Koch, NP  Multiple Vitamin (MULTIVITAMIN) tablet Take 1 tablet by mouth daily.   Yes [provider]  zinc gluconate 50 MG tablet Take 50 mg by mouth daily.   Yes [provider]  famotidine (PEPCID) 20 MG tablet Take 1 tablet (20 mg total) by mouth daily. 12/18/20 03/18/21  Arthur Manifold, MD  omeprazole (PRILOSEC) 40 MG capsule Take 1 capsule (40 mg total) by mouth daily. 09/09/20 11/04/20  Arthur Manifold, MD    Family History  Problem Relation Age of Onset   Hyperlipidemia Mother    Hypertension Mother    Cancer Sister      Social History   Tobacco Use   Smoking status: Never   Smokeless tobacco: Never  Vaping Use   Vaping Use: Never used  Substance Use Topics   Alcohol use: Yes    Comment: 2 x beers per day   Drug use: No    Allergies as of 12/18/2020   (No Known Allergies)    Physical Examination:  Constitutional: General:   Alert,  Well-developed, well-nourished, pleasant and cooperative in NAD BP 126/89   Pulse 65   Temp 98.4 F (36.9 C) (Oral)   Ht 6' (1.829 m)   Wt 220 lb (99.8 kg)   BMI 29.84 kg/m   Respiratory: Normal respiratory effort  Gastrointestinal:  Soft, non-tender and non-distended without masses, hepatosplenomegaly or  hernias noted.  No guarding or rebound tenderness.     Cardiac: No clubbing or edema.  No cyanosis. Normal posterior tibial pedal pulses noted.  Psych:  Alert and cooperative. Normal mood and affect.  Musculoskeletal:  Normal gait. Head normocephalic, atraumatic. Symmetrical without gross deformities. 5/5 Lower extremity strength bilaterally.  Skin: Warm. Intact without significant lesions or rashes. No jaundice.  Neck: Supple, trachea midline  Lymph: No cervical lymphadenopathy  Psych:  Alert and oriented x3, Alert and cooperative. Normal mood and affect.  Labs: CMP     Component Value Date/Time    NA 136 10/03/2020 0945   K 4.2 10/03/2020 0945   CL 101 10/03/2020 0945   CO2 27 10/03/2020 0945   GLUCOSE 95 10/03/2020 0945   BUN 10 10/03/2020 0945   CREATININE 0.96 10/03/2020 0945   CREATININE 0.81 04/03/2012 0843   CALCIUM 9.4 10/03/2020 0945   PROT 7.6 10/03/2020 0945   ALBUMIN 4.5 10/03/2020 0945   AST 28 10/03/2020 0945   ALT 48 10/03/2020 0945   ALKPHOS 48 10/03/2020 0945   BILITOT 0.5 10/03/2020 0945   GFRNONAA >60 12/14/2018 1244   GFRAA >60 12/14/2018 1244   Lab Results  Component Value Date   WBC 4.5 10/03/2020   HGB 14.9 10/03/2020   HCT 44.2 10/03/2020   MCV 87.4 10/03/2020   PLT 231.0 10/03/2020    Imaging Studies:   Assessment and Plan:   THORN PHOUTHAVONG is a 45 y.o. 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  He would prefer to have a low volume or tablet prep on his next procedure as he states with the gallon prep, he did have vomiting toward the end of the prep and does not think he would like to use the gallon prep again.  Patient states he will reach back out to [place] after speaking with his insurance and after he makes up his mind about when he would like his procedures done  We extensively discussed discontinuing his H2 RA given that he is clinically asymptomatic, with the caveat that as soon as his symptoms return he lets [place] know.  Because last time by the time we did his EGD he had grade C esophagitis and he  already had symptoms for 6 to 9 months which led to his dysphagia also.  After discussing this in detail, patient prefers to stay on H2 RA therapy at this time since it is controlling his symptoms well instead of discontinuing the medication.  Once he has his EGD and this confirmed resolution of esophagitis, he is willing to discontinue the medication then.  However, I did discuss with him that if he is not going to schedule his EGD for a long time, it would be beneficial to discontinue the H2 RA and resume it if symptoms recur.  He verbalized understanding of these instructions and states will call us back after speaking with his insurance  Clinic follow-up  scheduled in the next 2 to 3 months as well to rediscuss the above  Continue acid reflux lifestyle modifications.  His weight loss is also helped his symptoms overall    Dr Arthur Stevens

## 2020-12-27 DIAGNOSIS — Z3009 Encounter for other general counseling and advice on contraception: Secondary | ICD-10-CM | POA: Diagnosis not present

## 2020-12-31 ENCOUNTER — Other Ambulatory Visit: Payer: Self-pay | Admitting: Primary Care

## 2020-12-31 DIAGNOSIS — F411 Generalized anxiety disorder: Secondary | ICD-10-CM

## 2021-01-23 DIAGNOSIS — Z302 Encounter for sterilization: Secondary | ICD-10-CM | POA: Diagnosis not present

## 2021-01-28 ENCOUNTER — Other Ambulatory Visit: Payer: Self-pay

## 2021-01-29 NOTE — Telephone Encounter (Signed)
I spoke with Arthur Stevens at [facility] and pt did have available refills on atorvastatin 20 mg and Arthur Stevens will get refill for atorvastatin 20 mg ready for pick up. Pt notified and voiced understanding and was appreciative of call. Nothing further needed.

## 2021-03-19 ENCOUNTER — Ambulatory Visit (INDEPENDENT_AMBULATORY_CARE_PROVIDER_SITE_OTHER): Payer: BC Managed Care – PPO | Admitting: Gastroenterology

## 2021-03-19 VITALS — BP 136/91 | HR 80 | Temp 98.3°F | Wt 214.6 lb

## 2021-03-19 DIAGNOSIS — K209 Esophagitis, unspecified without bleeding: Secondary | ICD-10-CM | POA: Diagnosis not present

## 2021-03-19 DIAGNOSIS — Z1211 Encounter for screening for malignant neoplasm of colon: Secondary | ICD-10-CM | POA: Diagnosis not present

## 2021-03-19 NOTE — Progress Notes (Signed)
Arthur Antigua, MD [address]  [place]  [place], [place] [address]  Main: [phone]  Fax: [phone]   Primary Care Physician: Pleas Koch, NP   Chief Complaint  Patient presents with   Follow-up    3 month--Pt did not check with insurance on coverage for the repeat EGD  esophagitis  HPI: Arthur Stevens is a 45 y.o. male here for follow up of esophagitis. Pt was found to have Grade C esophagitis on previous EGD and repeat EGD was discussed on last visit. Pt wanted to verify costs with insurance before scheduling and was supposed to call us back, but has not checked with his insurance  Denies any breakthrough symptoms on H2RA therapy. The patient denies abdominal or flank pain, anorexia, nausea or vomiting, dysphagia, change in bowel habits or black or bloody stools or weight loss.  As per previous office notes: "It is important to note that patient never had symptoms of reflux, denied any heartburn prior to his onset of dysphagia either and his presenting symptom was dysphagia.  Therefore, by the time acid reflux caused any symptoms, it ended up being esophagitis causing his dysphagia about rather than the usual presenting complaint of heartburn.  Therefore, H2 RA therapy after his PPI is completed would benefit in this setting to prevent severe esophagitis again given that he usually does not feel the heartburn before he has a worse symptom of dysphagia"   ROS: All ROS reviewed and negative except as per HPI   Past Medical History:  Diagnosis Date   Allergy    Anxiety    Hyperlipidemia    Hypertension     Past Surgical History:  Procedure Laterality Date   COLONOSCOPY WITH PROPOFOL N/A 07/03/2020   Procedure: COLONOSCOPY WITH PROPOFOL;  Surgeon: Virgel Manifold, MD;  Location: [facility] ENDOSCOPY;  Service: Endoscopy;  Laterality: N/A;   ESOPHAGOGASTRODUODENOSCOPY (EGD) WITH PROPOFOL N/A 07/03/2020   Procedure: ESOPHAGOGASTRODUODENOSCOPY (EGD) WITH  PROPOFOL;  Surgeon: Virgel Manifold, MD;  Location: [facility] ENDOSCOPY;  Service: Endoscopy;  Laterality: N/A;    Prior to Admission medications   Medication Sig Start Date End Date Taking? Authorizing Provider  Ascorbic Acid (VITAMIN C) 100 MG tablet Take 100 mg by mouth daily.   Yes [provider]  atorvastatin (LIPITOR) 20 MG tablet TAKE 1 TABLET BY MOUTH DAILY. FOR CHOLESTEROL 10/29/20  Yes Pleas Koch, NP  cetirizine (ZYRTEC) 10 MG tablet Take 10 mg by mouth daily.   Yes [provider]  citalopram (CELEXA) 10 MG tablet TAKE 1 TABLET (10 MG TOTAL) BY MOUTH DAILY. FOR ANXIETY. 12/31/20  Yes Pleas Koch, NP  diphenhydramine-acetaminophen (TYLENOL PM) 25-500 MG TABS Take 1 tablet by mouth at bedtime as needed.   Yes [provider]  lisinopril (ZESTRIL) 10 MG tablet Take 1 tablet (10 mg total) by mouth daily. For blood pressure 09/25/20  Yes Pleas Koch, NP  Multiple Vitamin (MULTIVITAMIN) tablet Take 1 tablet by mouth daily.   Yes [provider]  zinc gluconate 50 MG tablet Take 50 mg by mouth daily.   Yes [provider]  famotidine (PEPCID) 20 MG tablet Take 1 tablet (20 mg total) by mouth daily. 12/18/20 03/18/21  Virgel Manifold, MD    Family History  Problem Relation Age of Onset   Hyperlipidemia Mother    Hypertension Mother    Cancer Sister      Social History   Tobacco Use   Smoking status:  Never   Smokeless tobacco: Never  Vaping Use   Vaping Use: Never used  Substance Use Topics   Alcohol use: Yes    Comment: 2 x beers per day   Drug use: No    Allergies as of 03/19/2021   (No Known Allergies)    Physical Examination:  Constitutional: General:   Alert,  Well-developed, well-nourished, pleasant and cooperative in NAD BP (!) 136/91    Pulse 80    Temp 98.3 F (36.8 C) (Oral)    Wt 214 lb 9.6 oz (97.3 kg)    BMI 29.10 kg/m   Respiratory: Normal respiratory effort  Gastrointestinal:  Soft,  non-tender and non-distended without masses, hepatosplenomegaly or hernias noted.  No guarding or rebound tenderness.     Cardiac: No clubbing or edema.  No cyanosis. Normal posterior tibial pedal pulses noted.  Psych:  Alert and cooperative. Normal mood and affect.  Musculoskeletal:  Normal gait. Head normocephalic, atraumatic. Symmetrical without gross deformities. 5/5 Lower extremity strength bilaterally.  Skin: Warm. Intact without significant lesions or rashes. No jaundice.  Neck: Supple, trachea midline  Lymph: No cervical lymphadenopathy  Psych:  Alert and oriented x3, Alert and cooperative. Normal mood and affect.  Labs: CMP     Component Value Date/Time   NA 136 10/03/2020 0945   K 4.2 10/03/2020 0945   CL 101 10/03/2020 0945   CO2 27 10/03/2020 0945   GLUCOSE 95 10/03/2020 0945   BUN 10 10/03/2020 0945   CREATININE 0.96 10/03/2020 0945   CREATININE 0.81 04/03/2012 0843   CALCIUM 9.4 10/03/2020 0945   PROT 7.6 10/03/2020 0945   ALBUMIN 4.5 10/03/2020 0945   AST 28 10/03/2020 0945   ALT 48 10/03/2020 0945   ALKPHOS 48 10/03/2020 0945   BILITOT 0.5 10/03/2020 0945   GFRNONAA >60 12/14/2018 1244   GFRAA >60 12/14/2018 1244   Lab Results  Component Value Date   WBC 4.5 10/03/2020   HGB 14.9 10/03/2020   HCT 44.2 10/03/2020   MCV 87.4 10/03/2020   PLT 231.0 10/03/2020    Imaging Studies:   Assessment and Plan:   [name] is a 45 y.o. y/o male Here for follow-up of grade C esophagitis  Patient is asymptomatic and is trying to eat a healthy diet Is doing well on H2 RA therapy  We discussed repeat upper endoscopy and patient is willing to schedule at this time and has been scheduled for December 27  He is also due for repeat colonoscopy due to suboptimal prep.  He states he received 1 gallon prep last time and due to emesis of part of the prep, he had suboptimal prep.  We will try to prescribe lower volume prep or Sutabs  Continue antireflux  lifestyle measures  I have discussed alternative options, risks & benefits,  which include, but are not limited to, bleeding, infection, perforation,respiratory complication & drug reaction.  The patient agrees with this plan & written consent will be obtained.      Arthur Stevens

## 2021-04-01 ENCOUNTER — Ambulatory Visit
Admission: RE | Admit: 2021-04-01 | Payer: BC Managed Care – PPO | Source: Home / Self Care | Admitting: Gastroenterology

## 2021-04-01 ENCOUNTER — Encounter: Admission: RE | Payer: Self-pay | Source: Home / Self Care

## 2021-04-01 SURGERY — ESOPHAGOGASTRODUODENOSCOPY (EGD) WITH PROPOFOL
Anesthesia: Choice

## 2021-09-17 ENCOUNTER — Other Ambulatory Visit: Payer: Self-pay | Admitting: Primary Care

## 2021-09-17 DIAGNOSIS — F411 Generalized anxiety disorder: Secondary | ICD-10-CM

## 2021-10-10 ENCOUNTER — Other Ambulatory Visit: Payer: Self-pay | Admitting: Primary Care

## 2021-10-10 DIAGNOSIS — F411 Generalized anxiety disorder: Secondary | ICD-10-CM

## 2021-10-28 ENCOUNTER — Other Ambulatory Visit: Payer: Self-pay | Admitting: Primary Care

## 2021-10-28 DIAGNOSIS — F411 Generalized anxiety disorder: Secondary | ICD-10-CM

## 2021-10-29 MED ORDER — CITALOPRAM HYDROBROMIDE 10 MG PO TABS: 10.0000 mg | ORAL_TABLET | Freq: Every day | ORAL | 0 refills | Status: DC

## 2021-10-29 NOTE — Addendum Note (Signed)
Addended by: Pleas Koch on: 10/29/2021 07:05 PM   Modules accepted: Orders

## 2021-10-29 NOTE — Telephone Encounter (Signed)
Called patient I have set up for follow for next available on 8/3. Has ran out of meds yesterday request refill to be called in if able.

## 2021-10-30 ENCOUNTER — Other Ambulatory Visit: Payer: Self-pay | Admitting: Primary Care

## 2021-10-30 DIAGNOSIS — E785 Hyperlipidemia, unspecified: Secondary | ICD-10-CM

## 2021-11-06 ENCOUNTER — Ambulatory Visit: Payer: BC Managed Care – PPO | Admitting: Primary Care

## 2021-11-06 ENCOUNTER — Encounter: Payer: Self-pay | Admitting: Primary Care

## 2021-11-06 VITALS — BP 130/76 | HR 76 | Temp 98.6°F | Ht 72.0 in | Wt 223.0 lb

## 2021-11-06 DIAGNOSIS — R1319 Other dysphagia: Secondary | ICD-10-CM | POA: Diagnosis not present

## 2021-11-06 DIAGNOSIS — E785 Hyperlipidemia, unspecified: Secondary | ICD-10-CM | POA: Diagnosis not present

## 2021-11-06 DIAGNOSIS — R7303 Prediabetes: Secondary | ICD-10-CM | POA: Diagnosis not present

## 2021-11-06 DIAGNOSIS — F411 Generalized anxiety disorder: Secondary | ICD-10-CM

## 2021-11-06 DIAGNOSIS — Z0000 Encounter for general adult medical examination without abnormal findings: Secondary | ICD-10-CM | POA: Diagnosis not present

## 2021-11-06 DIAGNOSIS — I10 Essential (primary) hypertension: Secondary | ICD-10-CM

## 2021-11-06 DIAGNOSIS — Z1211 Encounter for screening for malignant neoplasm of colon: Secondary | ICD-10-CM

## 2021-11-06 NOTE — Progress Notes (Signed)
Subjective:    Patient ID: Arthur Stevens, male    DOB: [birth year], 46 y.o.   MRN: [id number]  HPI  Arthur Stevens is a very pleasant 46 y.o. male who presents today for complete physical and follow up of chronic conditions.  Immunizations: -Tetanus: 2015 -Influenza: Due this season  -Covid-19: 3 vaccines   Diet: Fair diet.  Exercise: No regular exercise.  Eye exam: Completed several years  Dental exam: Completes semi-annually   Colonoscopy: Completed in March 2022, due September 2022 but hasn't returned. He plans on returning soon.   BP Readings from Last 3 Encounters:  11/06/21 130/76  03/19/21 (!) 136/91  12/18/20 126/89        Review of Systems  Constitutional:  Negative for unexpected weight change.  HENT:  Negative for rhinorrhea.   Respiratory:  Negative for cough and shortness of breath.   Cardiovascular:  Negative for chest pain.  Gastrointestinal:  Negative for constipation and diarrhea.  Genitourinary:  Negative for difficulty urinating.  Musculoskeletal:  Negative for arthralgias and myalgias.  Skin:  Negative for rash.  Allergic/Immunologic: Negative for environmental allergies.  Neurological:  Negative for dizziness and headaches.  Psychiatric/Behavioral:  The patient is not nervous/anxious.          Past Medical History:  Diagnosis Date   Allergy    Anxiety    Hyperlipidemia    Hypertension     Social History   Socioeconomic History   Marital status: Married    Spouse name: Not on file   Number of children: Not on file   Years of education: Not on file   Highest education level: Not on file  Occupational History   Not on file  Tobacco Use   Smoking status: Never   Smokeless tobacco: Never  Vaping Use   Vaping Use: Never used  Substance and Sexual Activity   Alcohol use: Yes    Comment: 2 x beers per day   Drug use: No   Sexual activity: Yes    Birth control/protection: None  Other Topics Concern   Not on file  Social  History Narrative   Married.   2 children.   Works as a Financial risk analyst.   Enjoys spending time with children, playing golf   Social Determinants of Health   Financial Resource Strain: Not on file  Food Insecurity: Not on file  Transportation Needs: Not on file  Physical Activity: Not on file  Stress: Not on file  Social Connections: Not on file  Intimate Partner Violence: Not on file    Past Surgical History:  Procedure Laterality Date   COLONOSCOPY WITH PROPOFOL N/A 07/03/2020   Procedure: COLONOSCOPY WITH PROPOFOL;  Surgeon: Virgel Manifold, MD;  Location: [facility];  Service: Endoscopy;  Laterality: N/A;   ESOPHAGOGASTRODUODENOSCOPY (EGD) WITH PROPOFOL N/A 07/03/2020   Procedure: ESOPHAGOGASTRODUODENOSCOPY (EGD) WITH PROPOFOL;  Surgeon: Virgel Manifold, MD;  Location: [facility] ENDOSCOPY;  Service: Endoscopy;  Laterality: N/A;    Family History  Problem Relation Age of Onset   Hyperlipidemia Mother    Hypertension Mother    Cancer Sister     No Known Allergies  Current Outpatient Medications on File Prior to Visit  Medication Sig Dispense Refill   Ascorbic Acid (VITAMIN C) 100 MG tablet Take 100 mg by mouth daily.     atorvastatin (LIPITOR) 20 MG tablet Take 1 tablet (20 mg total) by mouth daily. for cholesterol. Office visit required for further refills. 30 tablet 0  cetirizine (ZYRTEC) 10 MG tablet Take 10 mg by mouth daily.     citalopram (CELEXA) 10 MG tablet Take 1 tablet (10 mg total) by mouth daily. For anxiety. Office visit required for further refills. 30 tablet 0   diphenhydramine-acetaminophen (TYLENOL PM) 25-500 MG TABS Take 1 tablet by mouth at bedtime as needed.     lisinopril (ZESTRIL) 10 MG tablet Take 1 tablet (10 mg total) by mouth daily. For blood pressure 90 tablet 3   Multiple Vitamin (MULTIVITAMIN) tablet Take 1 tablet by mouth daily.     zinc gluconate 50 MG tablet Take 50 mg by mouth daily.     famotidine (PEPCID) 20 MG tablet Take 1  tablet (20 mg total) by mouth daily. (Patient not taking: Reported on 11/06/2021) 90 tablet 0   No current facility-administered medications on file prior to visit.    BP 130/76   Pulse 76   Temp 98.6 F (37 C) (Oral)   Ht 6' (1.829 m)   Wt 223 lb (101.2 kg)   SpO2 98%   BMI 30.24 kg/m  Objective:   Physical Exam HENT:     Right Ear: Tympanic membrane and ear canal normal.     Left Ear: Tympanic membrane and ear canal normal.     Nose: Nose normal.     Right Sinus: No maxillary sinus tenderness or frontal sinus tenderness.     Left Sinus: No maxillary sinus tenderness or frontal sinus tenderness.  Eyes:     Conjunctiva/sclera: Conjunctivae normal.  Neck:     Thyroid: No thyromegaly.     Vascular: No carotid bruit.  Cardiovascular:     Rate and Rhythm: Normal rate and regular rhythm.     Heart sounds: Normal heart sounds.  Pulmonary:     Effort: Pulmonary effort is normal.     Breath sounds: Normal breath sounds. No wheezing or rales.  Abdominal:     General: Bowel sounds are normal.     Palpations: Abdomen is soft.     Tenderness: There is no abdominal tenderness.  Musculoskeletal:        General: Normal range of motion.     Cervical back: Neck supple.  Skin:    General: Skin is warm and dry.  Neurological:     Mental Status: He is alert and oriented to person, place, and time.     Cranial Nerves: No cranial nerve deficit.     Deep Tendon Reflexes: Reflexes are normal and symmetric.  Psychiatric:        Mood and Affect: Mood normal.           Assessment & Plan:   Problem List Items Addressed This Visit       Cardiovascular and Mediastinum   Essential hypertension    Controlled.  Continue lisinopril 10 mg daily. CMP pending.         Digestive   Esophageal dysphagia    Continued.  Referral placed to GI for evaluation.       Relevant Orders   Ambulatory referral to Gastroenterology     Other   Hyperlipidemia    Repeat lipid panel  pending. Continue atorvastatin 20 mg daily.       Relevant Orders   Lipid panel   Comprehensive metabolic panel   CBC   Preventative health care - Primary    Immunizations UTD. Colonoscopy due, referral placed to GI.  Discussed the importance of a healthy diet and regular exercise in order for weight loss,  and to reduce the risk of further co-morbidity.  Exam stable. Labs pending.  Follow up in 1 year for repeat physical.       Prediabetes    Repeat A1C pending.  Discussed the importance of a healthy diet and regular exercise in order for weight loss, and to reduce the risk of further co-morbidity.       Relevant Orders   Hemoglobin A1c   GAD (generalized anxiety disorder)    Controlled.  Continue citalopram 10 mg daily.      Other Visit Diagnoses     Screening for colon cancer       Relevant Orders   Ambulatory referral to Gastroenterology          Pleas Koch, NP

## 2021-11-06 NOTE — Assessment & Plan Note (Signed)
Immunizations UTD. Colonoscopy due, referral placed to GI.  Discussed the importance of a healthy diet and regular exercise in order for weight loss, and to reduce the risk of further co-morbidity.  Exam stable. Labs pending.  Follow up in 1 year for repeat physical.

## 2021-11-06 NOTE — Assessment & Plan Note (Signed)
Repeat lipid panel pending.  Continue atorvastatin 20 mg daily. 

## 2021-11-06 NOTE — Assessment & Plan Note (Signed)
Repeat A1C pending.  Discussed the importance of a healthy diet and regular exercise in order for weight loss, and to reduce the risk of further co-morbidity.  

## 2021-11-06 NOTE — Assessment & Plan Note (Signed)
Continued.  Referral placed to GI for evaluation.

## 2021-11-06 NOTE — Assessment & Plan Note (Signed)
Controlled.  Continue citalopram 10 mg daily.  

## 2021-11-06 NOTE — Patient Instructions (Signed)
Stop by the lab prior to leaving today. I will notify you of your results once received.   You will be contacted regarding your referral to GI.  Please let us know if you have not been contacted within two weeks.   It was a pleasure to see you today!  Preventive Care 69-48 Years Old, Male Preventive care refers to lifestyle choices and visits with your health care provider that can promote health and wellness. Preventive care visits are also called wellness exams. What can I expect for my preventive care visit? Counseling During your preventive care visit, your health care provider may ask about your: Medical history, including: Past medical problems. Family medical history. Current health, including: Emotional well-being. Home life and relationship well-being. Sexual activity. Lifestyle, including: Alcohol, nicotine or tobacco, and drug use. Access to firearms. Diet, exercise, and sleep habits. Safety issues such as seatbelt and bike helmet use. Sunscreen use. Work and work Statistician. Physical exam Your health care provider will check your: Height and weight. These may be used to calculate your BMI (body mass index). BMI is a measurement that tells if you are at a healthy weight. Waist circumference. This measures the distance around your waistline. This measurement also tells if you are at a healthy weight and may help predict your risk of certain diseases, such as type 2 diabetes and high blood pressure. Heart rate and blood pressure. Body temperature. Skin for abnormal spots. What immunizations do I need?  Vaccines are usually given at various ages, according to a schedule. Your health care provider will recommend vaccines for you based on your age, medical history, and lifestyle or other factors, such as travel or where you work. What tests do I need? Screening Your health care provider may recommend screening tests for certain conditions. This may include: Lipid and  cholesterol levels. Diabetes screening. This is done by checking your blood sugar (glucose) after you have not eaten for a while (fasting). Hepatitis B test. Hepatitis C test. HIV (human immunodeficiency virus) test. STI (sexually transmitted infection) testing, if you are at risk. Lung cancer screening. Prostate cancer screening. Colorectal cancer screening. Talk with your health care provider about your test results, treatment options, and if necessary, the need for more tests. Follow these instructions at home: Eating and drinking  Eat a diet that includes fresh fruits and vegetables, whole grains, lean protein, and low-fat dairy products. Take vitamin and mineral supplements as recommended by your health care provider. Do not drink alcohol if your health care provider tells you not to drink. If you drink alcohol: Limit how much you have to 0-2 drinks a day. Know how much alcohol is in your drink. In the [place] one drink equals one 12 oz bottle of beer (355 mL), one 5 oz glass of wine (148 mL), or one 1 oz glass of hard liquor (44 mL). Lifestyle Brush your teeth every morning and night with fluoride toothpaste. Floss one time each day. Exercise for at least 30 minutes 5 or more days each week. Do not use any products that contain nicotine or tobacco. These products include cigarettes, chewing tobacco, and vaping devices, such as e-cigarettes. If you need help quitting, ask your health care provider. Do not use drugs. If you are sexually active, practice safe sex. Use a condom or other form of protection to prevent STIs. Take aspirin only as told by your health care provider. Make sure that you understand how much to take and what form to take. Work  with your health care provider to find out whether it is safe and beneficial for you to take aspirin daily. Find healthy ways to manage stress, such as: Meditation, yoga, or listening to music. Journaling. Talking to a trusted  person. Spending time with friends and family. Minimize exposure to UV radiation to reduce your risk of skin cancer. Safety Always wear your seat belt while driving or riding in a vehicle. Do not drive: If you have been drinking alcohol. Do not ride with someone who has been drinking. When you are tired or distracted. While texting. If you have been using any mind-altering substances or drugs. Wear a helmet and other protective equipment during sports activities. If you have firearms in your house, make sure you follow all gun safety procedures. What's next? Go to your health care provider once a year for an annual wellness visit. Ask your health care provider how often you should have your eyes and teeth checked. Stay up to date on all vaccines. This information is not intended to replace advice given to you by your health care provider. Make sure you discuss any questions you have with your health care provider. Document Revised: 09/18/2020 Document Reviewed: 09/18/2020 Elsevier Patient Education  [place].

## 2021-11-06 NOTE — Assessment & Plan Note (Signed)
Controlled.   Continue lisinopril 10 mg daily.  CMP pending.  

## 2021-11-07 LAB — LIPID PANEL
Cholesterol: 205 mg/dL — ABNORMAL HIGH (ref 0–200)
HDL: 52.1 mg/dL (ref >39.00)
LDL Cholesterol: 120 mg/dL — ABNORMAL HIGH (ref 0–99)
NonHDL: 153.06
Total CHOL/HDL Ratio: 4
Triglycerides: 163 mg/dL — ABNORMAL HIGH (ref 0.0–149.0)
VLDL: 32.6 mg/dL (ref 0.0–40.0)

## 2021-11-07 LAB — COMPREHENSIVE METABOLIC PANEL
ALT: 45 U/L (ref 0–53)
AST: 28 U/L (ref 0–37)
Albumin: 4.7 g/dL (ref 3.5–5.2)
Alkaline Phosphatase: 61 U/L (ref 39–117)
BUN: 19 mg/dL (ref 6–23)
CO2: 25 mEq/L (ref 19–32)
Calcium: 9.7 mg/dL (ref 8.4–10.5)
Chloride: 100 mEq/L (ref 96–112)
Creatinine, Ser: 0.95 mg/dL (ref 0.40–1.50)
GFR: 95.26 mL/min (ref >60.00)
Glucose, Bld: 89 mg/dL (ref 70–99)
Potassium: 4 mEq/L (ref 3.5–5.1)
Sodium: 134 mEq/L — ABNORMAL LOW (ref 135–145)
Total Bilirubin: 0.4 mg/dL (ref 0.2–1.2)
Total Protein: 7.5 g/dL (ref 6.0–8.3)

## 2021-11-07 LAB — CBC
HCT: 43.3 % (ref 39.0–52.0)
Hemoglobin: 14.4 g/dL (ref 13.0–17.0)
MCHC: 33.4 g/dL (ref 30.0–36.0)
MCV: 89.5 fl (ref 78.0–100.0)
Platelets: 217 10*3/uL (ref 150.0–400.0)
RBC: 4.84 Mil/uL (ref 4.22–5.81)
RDW: 13.6 % (ref 11.5–15.5)
WBC: 6.1 10*3/uL (ref 4.0–10.5)

## 2021-11-07 LAB — HEMOGLOBIN A1C: Hgb A1c MFr Bld: 5.9 % (ref 4.6–6.5)

## 2021-11-11 ENCOUNTER — Other Ambulatory Visit: Payer: Self-pay | Admitting: Primary Care

## 2021-11-11 DIAGNOSIS — E785 Hyperlipidemia, unspecified: Secondary | ICD-10-CM

## 2021-11-11 MED ORDER — ATORVASTATIN CALCIUM 40 MG PO TABS: 40.0000 mg | ORAL_TABLET | Freq: Every day | ORAL | 3 refills | Status: DC

## 2021-11-21 ENCOUNTER — Other Ambulatory Visit: Payer: Self-pay | Admitting: Primary Care

## 2021-11-21 DIAGNOSIS — F411 Generalized anxiety disorder: Secondary | ICD-10-CM

## 2021-12-13 ENCOUNTER — Other Ambulatory Visit: Payer: Self-pay | Admitting: Primary Care

## 2021-12-13 DIAGNOSIS — I10 Essential (primary) hypertension: Secondary | ICD-10-CM

## 2021-12-31 ENCOUNTER — Other Ambulatory Visit: Payer: Self-pay

## 2021-12-31 ENCOUNTER — Encounter: Payer: Self-pay | Admitting: Gastroenterology

## 2021-12-31 ENCOUNTER — Ambulatory Visit (INDEPENDENT_AMBULATORY_CARE_PROVIDER_SITE_OTHER): Payer: BC Managed Care – PPO | Admitting: Gastroenterology

## 2021-12-31 VITALS — BP 156/83 | HR 80 | Temp 99.0°F | Ht 72.0 in | Wt 227.2 lb

## 2021-12-31 DIAGNOSIS — Z1211 Encounter for screening for malignant neoplasm of colon: Secondary | ICD-10-CM | POA: Diagnosis not present

## 2021-12-31 DIAGNOSIS — K2210 Ulcer of esophagus without bleeding: Secondary | ICD-10-CM | POA: Diagnosis not present

## 2021-12-31 DIAGNOSIS — K209 Esophagitis, unspecified without bleeding: Secondary | ICD-10-CM

## 2021-12-31 MED ORDER — OMEPRAZOLE 40 MG PO CPDR: 40.0000 mg | DELAYED_RELEASE_CAPSULE | Freq: Every day | ORAL | 0 refills | Status: DC

## 2021-12-31 MED ORDER — NA SULFATE-K SULFATE-MG SULF 17.5-3.13-1.6 GM/177ML PO SOLN: 354.0000 mL | Freq: Once | ORAL | 0 refills | Status: AC

## 2021-12-31 MED ORDER — ONDANSETRON HCL 4 MG PO TABS: 4.0000 mg | ORAL_TABLET | Freq: Three times a day (TID) | ORAL | 0 refills | Status: DC | PRN

## 2021-12-31 NOTE — Progress Notes (Signed)
Cephas Darby, MD [address]  [place]  [place], [place] [address]  Main: [phone]  Fax: [phone]    Gastroenterology Consultation  Referring Provider:     Pleas Koch, NP Primary Care Physician:  Pleas Koch, NP Primary Gastroenterologist:  Dr. Cephas Darby Reason for Consultation: Dysphagia, discuss about colonoscopy        HPI:   Arthur Stevens is a 46 y.o. male referred by Dr. Carlis Abbott, Leticia Penna, NP  for consultation & management of dysphagia.  Patient was diagnosed with severe erosive esophagitis based on the upper endoscopy in 06/2020.  He was on 3 months course of omeprazole 40 mg p.o. twice daily which resulted in complete resolution of his symptoms.  Later, switched to H2 blocker as needed.  He was supposed to have a follow-up endoscopy, did not undergo yet pending if the repeat procedure would be covered by his insurance.  He is also due for his colonoscopy due to fair prep.  Patient reports that lately has been experiencing return of difficulty swallowing, food getting stuck associated with regurgitation, especially hard meats.  He does report occasional heartburn only.  He drinks 2 cups of coffee, 2 cans of Diet Coke daily, does drink up to 5-6 beers from Thursday through Sunday regularly.  Patient does not smoke or vape  NSAIDs: None  Antiplts/Anticoagulants/Anti thrombotics: None  GI Procedures:  EGD and colonoscopy 07/03/2020 - LA Grade C reflux esophagitis with no bleeding. Biopsied. - Esophageal ulcer with no bleeding and no stigmata of recent bleeding. - Nodular mucosa in the esophagus. Biopsied. - Erythematous mucosa in the antrum. Biopsied. - 2 cm hiatal hernia. - Erythematous duodenopathy. - Biopsies were obtained in the gastric body, at the incisura and in the gastric antrum.  - Preparation of the colon was fair. - The rectum, sigmoid colon, descending colon, transverse colon, ascending colon and cecum are normal. - The  distal rectum and anal verge are normal on retroflexion view. - No specimens collected.  DIAGNOSIS:  A. STOMACH; COLD BIOPSY:  - CHANGES SUGGESTIVE OF HEALING MUCOSAL INJURY.  - NEGATIVE FOR ACTIVE INFLAMMATION AND H PYLORI.  - NEGATIVE FOR INTESTINAL METAPLASIA, DYSPLASIA, AND MALIGNANCY.   B. ESOPHAGUS, THICKENING; COLD BIOPSY:  - SQUAMOCOLUMNAR JUNCTION WITH REACTIVE FOVEOLAR HYPERPLASIA AND  FEATURES SUGGESTIVE OF REFLUX.  - NEGATIVE FOR INTESTINAL METAPLASIA, DYSPLASIA, AND MALIGNANCY.   C. ESOPHAGUS, DISTAL; COLD BIOPSY:  - SQUAMOCOLUMNAR JUNCTION WITH NO SIGNIFICANT PATHOLOGIC ALTERATION.  - NEGATIVE FOR INCREASED EOSINOPHILS.  - NEGATIVE FOR INTESTINAL METAPLASIA, DYSPLASIA, AND MALIGNANCY.   D. ESOPHAGUS, PROXIMAL; COLD BIOPSY:  - SQUAMOUS MUCOSA WITH NO SIGNIFICANT PATHOLOGIC ALTERATION.  - NEGATIVE FOR INCREASED EOSINOPHILS.  - NEGATIVE FOR INTESTINAL METAPLASIA, DYSPLASIA, AND MALIGNANCY.   Past Medical History:  Diagnosis Date   Allergy    Anxiety    Hyperlipidemia    Hypertension     Past Surgical History:  Procedure Laterality Date   COLONOSCOPY WITH PROPOFOL N/A 07/03/2020   Procedure: COLONOSCOPY WITH PROPOFOL;  Surgeon: Virgel Manifold, MD;  Location: [facility]COPY;  Service: Endoscopy;  Laterality: N/A;   ESOPHAGOGASTRODUODENOSCOPY (EGD) WITH PROPOFOL N/A 07/03/2020   Procedure: ESOPHAGOGASTRODUODENOSCOPY (EGD) WITH PROPOFOL;  Surgeon: Virgel Manifold, MD;  Location: [facility];  Service: Endoscopy;  Laterality: N/A;     Current Outpatient Medications:    Ascorbic Acid (VITAMIN C) 100 MG tablet, Take 100 mg by mouth daily., Disp: , Rfl:    atorvastatin (LIPITOR) 40 MG  tablet, Take 1 tablet (40 mg total) by mouth daily. for cholesterol., Disp: 90 tablet, Rfl: 3   cetirizine (ZYRTEC) 10 MG tablet, Take 10 mg by mouth daily., Disp: , Rfl:    citalopram (CELEXA) 10 MG tablet, Take 1 tablet (10 mg total) by mouth daily. For anxiety, Disp: 90  tablet, Rfl: 3   diphenhydramine-acetaminophen (TYLENOL PM) 25-500 MG TABS, Take 1 tablet by mouth at bedtime as needed., Disp: , Rfl:    lisinopril (ZESTRIL) 10 MG tablet, TAKE 1 TABLET BY MOUTH EVERY DAY FOR BLOOD PRESSURE, Disp: 90 tablet, Rfl: 3   Multiple Vitamin (MULTIVITAMIN) tablet, Take 1 tablet by mouth daily., Disp: , Rfl:    Na Sulfate-K Sulfate-Mg Sulf 17.5-3.13-1.6 GM/177ML SOLN, Take 354 mLs by mouth once for 1 dose., Disp: 354 mL, Rfl: 0   omeprazole (PRILOSEC) 40 MG capsule, Take 1 capsule (40 mg total) by mouth daily before breakfast., Disp: 90 capsule, Rfl: 0   ondansetron (ZOFRAN) 4 MG tablet, Take 1 tablet (4 mg total) by mouth every 8 (eight) hours as needed for nausea or vomiting., Disp: 10 tablet, Rfl: 0   zinc gluconate 50 MG tablet, Take 50 mg by mouth daily., Disp: , Rfl:    Family History  Problem Relation Age of Onset   Hyperlipidemia Mother    Hypertension Mother    Cancer Sister      Social History   Tobacco Use   Smoking status: Never   Smokeless tobacco: Never  Vaping Use   Vaping Use: Never used  Substance Use Topics   Alcohol use: Yes    Comment: 2 x beers per day   Drug use: No    Allergies as of 12/31/2021   (No Known Allergies)    Review of Systems:    All systems reviewed and negative except where noted in HPI.   Physical Exam:  BP (!) 156/83 (BP Location: Left Arm, Patient Position: Sitting, Cuff Size: Normal)   Pulse 80   Temp 99 F (37.2 C) (Oral)   Ht 6' (1.829 m)   Wt 227 lb 4 oz (103.1 kg)   BMI 30.82 kg/m  No LMP for male patient.  General:   Alert,  Well-developed, well-nourished, pleasant and cooperative in NAD Head:  Normocephalic and atraumatic. Eyes:  Sclera clear, no icterus.   Conjunctiva pink. Ears:  Normal auditory acuity. Nose:  No deformity, discharge, or lesions. Mouth:  No deformity or lesions,oropharynx pink & moist. Neck:  Supple; no masses or thyromegaly. Lungs:  Respirations even and unlabored.   Clear throughout to auscultation.   No wheezes, crackles, or rhonchi. No acute distress. Heart:  Regular rate and rhythm; no murmurs, clicks, rubs, or gallops. Abdomen:  Normal bowel sounds. Soft, non-tender and non-distended without masses, hepatosplenomegaly or hernias noted.  No guarding or rebound tenderness.   Rectal: Not performed Msk:  Symmetrical without gross deformities. Good, equal movement & strength bilaterally. Pulses:  Normal pulses noted. Extremities:  No clubbing or edema.  No cyanosis. Neurologic:  Alert and oriented x3;  grossly normal neurologically. Skin:  Intact without significant lesions or rashes. No jaundice. Psych:  Alert and cooperative. Normal mood and affect.  Imaging Studies: Reviewed  Assessment and Plan:   Arthur Stevens is a 46 y.o. pleasant Caucasian male with history of hypertension, hyperlipidemia, LA grade C esophagitis is seen in consultation for recurrence of dysphagia  History of erosive esophagitis, recurrence of dysphagia EGD in the past did not reveal eosinophilic esophagitis Start  omeprazole 40 mg once a day before breakfast Discussed about antireflux lifestyle Strongly reiterated on cutting back on carbonated beverages, coffee and alcohol use Recommend repeat EGD to evaluate for erosive esophagitis and rule out any Barrett's esophagus Discussed with patient regarding long-term sequelae of persistent erosive esophagitis and risk for developing visual stricture, Barrett's esophagus  Colon cancer screening Patient did not tolerate GoLytely before Recommend low-volume prep, takes Zofran as needed during bowel prep Advised to drink adequate amount of fluids with the prep  Follow up in 4 months   Cephas Darby, MD

## 2022-01-15 ENCOUNTER — Encounter: Payer: Self-pay | Admitting: Gastroenterology

## 2022-01-22 ENCOUNTER — Ambulatory Visit
Admission: RE | Admit: 2022-01-22 | Discharge: 2022-01-22 | Disposition: A | Discharge status: Home / Self Care | Payer: BC Managed Care – PPO | Attending: Gastroenterology | Admitting: Gastroenterology

## 2022-01-22 ENCOUNTER — Encounter
Admission: RE | Disposition: A | Discharge status: Home / Self Care | Payer: Self-pay | Source: Home / Self Care | Attending: Gastroenterology

## 2022-01-22 ENCOUNTER — Encounter: Payer: Self-pay | Admitting: Gastroenterology

## 2022-01-22 ENCOUNTER — Ambulatory Visit: Payer: BC Managed Care – PPO | Admitting: Anesthesiology

## 2022-01-22 ENCOUNTER — Other Ambulatory Visit: Payer: Self-pay

## 2022-01-22 DIAGNOSIS — D125 Benign neoplasm of sigmoid colon: Secondary | ICD-10-CM | POA: Insufficient documentation

## 2022-01-22 DIAGNOSIS — I10 Essential (primary) hypertension: Secondary | ICD-10-CM | POA: Insufficient documentation

## 2022-01-22 DIAGNOSIS — E785 Hyperlipidemia, unspecified: Secondary | ICD-10-CM | POA: Insufficient documentation

## 2022-01-22 DIAGNOSIS — K209 Esophagitis, unspecified without bleeding: Secondary | ICD-10-CM

## 2022-01-22 DIAGNOSIS — K2101 Gastro-esophageal reflux disease with esophagitis, with bleeding: Secondary | ICD-10-CM | POA: Diagnosis not present

## 2022-01-22 DIAGNOSIS — K2210 Ulcer of esophagus without bleeding: Secondary | ICD-10-CM | POA: Diagnosis not present

## 2022-01-22 DIAGNOSIS — K635 Polyp of colon: Secondary | ICD-10-CM

## 2022-01-22 DIAGNOSIS — R1314 Dysphagia, pharyngoesophageal phase: Secondary | ICD-10-CM | POA: Diagnosis not present

## 2022-01-22 DIAGNOSIS — K449 Diaphragmatic hernia without obstruction or gangrene: Secondary | ICD-10-CM

## 2022-01-22 DIAGNOSIS — K2100 Gastro-esophageal reflux disease with esophagitis, without bleeding: Secondary | ICD-10-CM

## 2022-01-22 DIAGNOSIS — Z1211 Encounter for screening for malignant neoplasm of colon: Secondary | ICD-10-CM | POA: Insufficient documentation

## 2022-01-22 DIAGNOSIS — D126 Benign neoplasm of colon, unspecified: Secondary | ICD-10-CM | POA: Diagnosis not present

## 2022-01-22 DIAGNOSIS — F419 Anxiety disorder, unspecified: Secondary | ICD-10-CM | POA: Insufficient documentation

## 2022-01-22 HISTORY — PX: POLYPECTOMY: SHX5525

## 2022-01-22 HISTORY — PX: ESOPHAGOGASTRODUODENOSCOPY (EGD) WITH PROPOFOL: SHX5813

## 2022-01-22 HISTORY — PX: COLONOSCOPY WITH PROPOFOL: SHX5780

## 2022-01-22 SURGERY — COLONOSCOPY WITH PROPOFOL
Anesthesia: General | Site: Throat

## 2022-01-22 MED ORDER — STERILE WATER FOR IRRIGATION IR SOLN
Status: DC | PRN
  Administered 2022-01-22: .05 mL

## 2022-01-22 MED ORDER — OMEPRAZOLE 40 MG PO CPDR: 40.0000 mg | DELAYED_RELEASE_CAPSULE | Freq: Two times a day (BID) | ORAL | 2 refills | Status: DC

## 2022-01-22 MED ORDER — SODIUM CHLORIDE 0.9 % IV SOLN: INTRAVENOUS | Status: DC

## 2022-01-22 MED ORDER — LACTATED RINGERS IV SOLN: INTRAVENOUS | Status: DC

## 2022-01-22 MED ORDER — LIDOCAINE HCL (CARDIAC) PF 100 MG/5ML IV SOSY
PREFILLED_SYRINGE | INTRAVENOUS | Status: DC | PRN
  Administered 2022-01-22: 50 mg via INTRAVENOUS

## 2022-01-22 MED ORDER — STERILE WATER FOR IRRIGATION IR SOLN
Status: DC | PRN
  Administered 2022-01-22: 250 mL

## 2022-01-22 MED ORDER — PROPOFOL 10 MG/ML IV BOLUS
INTRAVENOUS | Status: DC | PRN
  Administered 2022-01-22 (×5): 50 mg via INTRAVENOUS
  Administered 2022-01-22: 100 mg via INTRAVENOUS
  Administered 2022-01-22: 25 mg via INTRAVENOUS

## 2022-01-22 SURGICAL SUPPLY — 10 items
BLOCK BITE 60FR ADLT L/F GRN (MISCELLANEOUS) ×3 IMPLANT
FORCEPS BIOP RAD 4 LRG CAP 4 (CUTTING FORCEPS) IMPLANT
GOWN CVR UNV OPN BCK APRN NK (MISCELLANEOUS) ×6 IMPLANT
GOWN ISOL THUMB LOOP REG UNIV (MISCELLANEOUS) ×6
KIT PRC NS LF DISP ENDO (KITS) ×3 IMPLANT
KIT PROCEDURE OLYMPUS (KITS) ×3
MANIFOLD NEPTUNE II (INSTRUMENTS) ×3 IMPLANT
SNARE COLD EXACTO (MISCELLANEOUS) IMPLANT
TRAP ETRAP POLY (MISCELLANEOUS) IMPLANT
WATER STERILE IRR 250ML POUR (IV SOLUTION) ×3 IMPLANT

## 2022-01-22 NOTE — H&P (Signed)
Cephas Darby, MD [address]  [place]  [place], [place] [address]  Main: [phone]  Fax: [phone] Pager: [phone]  Primary Care Physician:  Pleas Koch, NP Primary Gastroenterologist:  Dr. Cephas Darby  Pre-Procedure History & Physical: HPI:  Arthur Stevens is a 46 y.o. male is here for an endoscopy and colonoscopy.   Past Medical History:  Diagnosis Date   Allergy    Anxiety    Hyperlipidemia    Hypertension     Past Surgical History:  Procedure Laterality Date   COLONOSCOPY WITH PROPOFOL N/A 07/03/2020   Procedure: COLONOSCOPY WITH PROPOFOL;  Surgeon: Virgel Manifold, MD;  Location: [facility] ENDOSCOPY;  Service: Endoscopy;  Laterality: N/A;   ESOPHAGOGASTRODUODENOSCOPY (EGD) WITH PROPOFOL N/A 07/03/2020   Procedure: ESOPHAGOGASTRODUODENOSCOPY (EGD) WITH PROPOFOL;  Surgeon: Virgel Manifold, MD;  Location: [facility] ENDOSCOPY;  Service: Endoscopy;  Laterality: N/A;    Prior to Admission medications   Medication Sig Start Date End Date Taking? Authorizing Provider  Ascorbic Acid (VITAMIN C) 100 MG tablet Take 100 mg by mouth daily.   Yes [provider]  atorvastatin (LIPITOR) 40 MG tablet Take 1 tablet (40 mg total) by mouth daily. for cholesterol. 11/11/21  Yes Pleas Koch, NP  cetirizine (ZYRTEC) 10 MG tablet Take 10 mg by mouth daily.   Yes [provider]  citalopram (CELEXA) 10 MG tablet Take 1 tablet (10 mg total) by mouth daily. For anxiety 11/21/21  Yes Pleas Koch, NP  diphenhydramine-acetaminophen (TYLENOL PM) 25-500 MG TABS Take 1 tablet by mouth at bedtime as needed.   Yes [provider]  lisinopril (ZESTRIL) 10 MG tablet TAKE 1 TABLET BY MOUTH EVERY DAY FOR BLOOD PRESSURE 12/13/21  Yes Pleas Koch, NP  Melatonin 5 MG CHEW Chew by mouth at bedtime as needed.   Yes [provider]  Multiple Vitamin (MULTIVITAMIN) tablet Take 1 tablet by mouth daily.   Yes [provider]   omeprazole (PRILOSEC) 40 MG capsule Take 1 capsule (40 mg total) by mouth daily before breakfast. 12/31/21  Yes Leon Goodnow, Tally Due, MD  ondansetron (ZOFRAN) 4 MG tablet Take 1 tablet (4 mg total) by mouth every 8 (eight) hours as needed for nausea or vomiting. 12/31/21  Yes Symantha Steeber, Tally Due, MD  VITAMIN D PO Take by mouth daily.   Yes [provider]  zinc gluconate 50 MG tablet Take 50 mg by mouth daily.   Yes [provider]    Allergies as of 12/31/2021   (No Known Allergies)    Family History  Problem Relation Age of Onset   Hyperlipidemia Mother    Hypertension Mother    Cancer Sister     Social History   Socioeconomic History   Marital status: Married    Spouse name: Not on file   Number of children: Not on file   Years of education: Not on file   Highest education level: Not on file  Occupational History   Not on file  Tobacco Use   Smoking status: Never   Smokeless tobacco: Never  Vaping Use   Vaping Use: Never used  Substance and Sexual Activity   Alcohol use: Yes    Alcohol/week: 15.0 standard drinks of alcohol    Types: 15 Cans of beer per week    Comment: 5-6 beers on F, Sa &Su   Drug use: No   Sexual activity: Yes    Birth control/protection: None  Other Topics Concern  Not on file  Social History Narrative   Married.   2 children.   Works as a Financial risk analyst.   Enjoys spending time with children, playing golf   Social Determinants of Health   Financial Resource Strain: Not on file  Food Insecurity: Not on file  Transportation Needs: Not on file  Physical Activity: Not on file  Stress: Not on file  Social Connections: Not on file  Intimate Partner Violence: Not on file    Review of Systems: See HPI, otherwise negative ROS  Physical Exam: BP (!) 147/97   Pulse 69   Temp (!) 97.3 F (36.3 C) (Temporal)   Ht 6' (1.829 m)   Wt 99.3 kg   SpO2 99%   BMI 29.70 kg/m  General:   Alert,  pleasant and cooperative in  NAD Head:  Normocephalic and atraumatic. Neck:  Supple; no masses or thyromegaly. Lungs:  Clear throughout to auscultation.    Heart:  Regular rate and rhythm. Abdomen:  Soft, nontender and nondistended. Normal bowel sounds, without guarding, and without rebound.   Neurologic:  Alert and  oriented x4;  grossly normal neurologically.  Impression/Plan: Arthur Stevens is here for an endoscopy and colonoscopy to be performed for LA grade C esophagitis is seen in consultation for recurrence of dysphagia, colon cancer screening  Risks, benefits, limitations, and alternatives regarding  endoscopy and colonoscopy have been reviewed with the patient.  Questions have been answered.  All parties agreeable.   Sherri Sear, MD  01/22/2022, 7:34 AM

## 2022-01-22 NOTE — Transfer of Care (Signed)
Immediate Anesthesia Transfer of Care Note  Patient: Arthur Stevens  Procedure(s) Performed: COLONOSCOPY WITH PROPOFOL (Rectum) ESOPHAGOGASTRODUODENOSCOPY (EGD) WITH PROPOFOL (Throat) POLYPECTOMY (Rectum)  Patient Location: PACU  Anesthesia Type: MAC  Level of Consciousness: awake, alert  and patient cooperative  Airway and Oxygen Therapy: Patient Spontanous Breathing and Patient connected to supplemental oxygen  Post-op Assessment: Post-op Vital signs reviewed, Patient's Cardiovascular Status Stable, Respiratory Function Stable, Patent Airway and No signs of Nausea or vomiting  Post-op Vital Signs: Reviewed and stable  Complications: No notable events documented.

## 2022-01-22 NOTE — Anesthesia Preprocedure Evaluation (Signed)
Anesthesia Evaluation  Patient identified by MRN, date of birth, ID band Patient awake    Reviewed: Allergy & Precautions, NPO status , Patient's Chart, lab work & pertinent test results  History of Anesthesia Complications Negative for: history of anesthetic complications  Airway Mallampati: II  TM Distance: >3 FB     Dental  (+) Teeth Intact, Dental Advidsory Given   Pulmonary neg pulmonary ROS,    Pulmonary exam normal        Cardiovascular Exercise Tolerance: Good hypertension, (-) angina(-) CAD, (-) Past MI, (-) Cardiac Stents and (-) CABG Normal cardiovascular exam(-) dysrhythmias (-) Valvular Problems/Murmurs     Neuro/Psych PSYCHIATRIC DISORDERS Anxiety negative neurological ROS     GI/Hepatic negative GI ROS, Neg liver ROS,   Endo/Other  negative endocrine ROS  Renal/GU negative Renal ROS  negative genitourinary   Musculoskeletal negative musculoskeletal ROS (+)   Abdominal Normal abdominal exam  (+)   Peds negative pediatric ROS (+)  Hematology negative hematology ROS (+)   Anesthesia Other Findings Past Medical History: No date: Allergy No date: Anxiety No date: Hyperlipidemia No date: Hypertension  Reproductive/Obstetrics                             Anesthesia Physical  Anesthesia Plan  ASA: 2  Anesthesia Plan: General   Post-op Pain Management:    Induction: Intravenous  PONV Risk Score and Plan: Propofol infusion and TIVA  Airway Management Planned: Nasal Cannula and Natural Airway  Additional Equipment:   Intra-op Plan:   Post-operative Plan:   Informed Consent: I have reviewed the patients History and Physical, chart, labs and discussed the procedure including the risks, benefits and alternatives for the proposed anesthesia with the patient or authorized representative who has indicated his/her understanding and acceptance.     Dental advisory  given  Plan Discussed with: CRNA and Surgeon  Anesthesia Plan Comments:         Anesthesia Quick Evaluation

## 2022-01-22 NOTE — Op Note (Signed)
[facility] Gastroenterology Patient Name: Arthur Stevens Procedure Date: 01/22/2022 8:06 AM MRN: [id number] Account #: [id number] Date of Birth: Oct 10, 1975 Admit Type: Outpatient Age: 46 Room: [facility] OR ROOM 01 Gender: Male Note Status: Finalized Instrument Name: [id number] Procedure:             Upper GI endoscopy Indications:           Esophageal dysphagia, Follow-up of reflux esophagitis Providers:             Lin Landsman MD, MD Referring MD:          Lin Landsman MD, MD (Referring MD), Pleas Koch (Referring MD) Medicines:             General Anesthesia Complications:         No immediate complications. Estimated blood loss: None. Procedure:             Pre-Anesthesia Assessment:                        - Prior to the procedure, a History and Physical was                         performed, and patient medications and allergies were                         reviewed. The patient is competent. The risks and                         benefits of the procedure and the sedation options and                         risks were discussed with the patient. All questions                         were answered and informed consent was obtained.                         Patient identification and proposed procedure were                         verified by the physician, the nurse, the                         anesthesiologist, the anesthetist and the technician                         in the pre-procedure area in the procedure room in the                         endoscopy suite. Mental Status Examination: alert and                         oriented. Airway Examination: normal oropharyngeal                         airway and neck mobility. Respiratory Examination:  clear to auscultation. CV Examination: normal.                         Prophylactic Antibiotics: The patient does not require                          prophylactic antibiotics. Prior Anticoagulants: The                         patient has taken no previous anticoagulant or                         antiplatelet agents. ASA Grade Assessment: II - A                         patient with mild systemic disease. After reviewing                         the risks and benefits, the patient was deemed in                         satisfactory condition to undergo the procedure. The                         anesthesia plan was to use general anesthesia.                         Immediately prior to administration of medications,                         the patient was re-assessed for adequacy to receive                         sedatives. The heart rate, respiratory rate, oxygen                         saturations, blood pressure, adequacy of pulmonary                         ventilation, and response to care were monitored                         throughout the procedure. The physical status of the                         patient was re-assessed after the procedure.                        After obtaining informed consent, the endoscope was                         passed under direct vision. Throughout the procedure,                         the patient's blood pressure, pulse, and oxygen                         saturations were monitored continuously. The Endoscope  was introduced through the mouth, and advanced to the                         second part of duodenum. The upper GI endoscopy was                         accomplished without difficulty. The patient tolerated                         the procedure well. Findings:      The duodenal bulb and second portion of the duodenum were normal.      The entire examined stomach was normal.      The cardia and gastric fundus were normal on retroflexion.      A small hiatal hernia was present.      Esophagogastric landmarks were identified: the gastroesophageal junction       was found  at 36 cm from the incisors.      LA Grade C (one or more mucosal breaks continuous between tops of 2 or       more mucosal folds, less than 75% circumference) esophagitis with       bleeding was found in the lower third of the esophagus. [name] esophageal       Biopsies were taken with a cold forceps for histology. Impression:            - Normal duodenal bulb and second portion of the                         duodenum.                        - Normal stomach.                        - Small hiatal hernia.                        - Esophagogastric landmarks identified.                        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                         Biopsied. Recommendation:        - Await pathology results.                        - Follow an antireflux regimen.                        - Use a proton pump inhibitor PO BID for 3 months.                        - Repeat upper endoscopy in 3 months to check healing.                        - Proceed with colonoscopy as scheduled                        See colonoscopy report Procedure Code(s):     --- Professional ---  80881, Esophagogastroduodenoscopy, flexible,                         transoral; with biopsy, single or multiple Diagnosis Code(s):     --- Professional ---                        K44.9, Diaphragmatic hernia without obstruction or                         gangrene                        K21.01, Gastro-esophageal reflux disease with                         esophagitis, with bleeding                        R13.14, Dysphagia, pharyngoesophageal phase                        K21.00, Gastro-esophageal reflux disease with                         esophagitis, without bleeding CPT copyright 2019 American Medical Association. All rights reserved. The codes documented in this report are preliminary and upon coder review may  be revised to meet current compliance requirements. Dr. Ulyess Mort Lin Landsman MD, MD 01/22/2022 8:26:28 AM This report has been signed electronically. Number of Addenda: 0 Note Initiated On: 01/22/2022 8:06 AM Total Procedure Duration: 0 hours 4 minutes 49 seconds  Estimated Blood Loss:  Estimated blood loss: none.      [facility]

## 2022-01-22 NOTE — Anesthesia Postprocedure Evaluation (Signed)
Anesthesia Post Note  Patient: Arthur Stevens  Procedure(s) Performed: COLONOSCOPY WITH PROPOFOL (Rectum) ESOPHAGOGASTRODUODENOSCOPY (EGD) WITH PROPOFOL (Throat) POLYPECTOMY (Rectum)  Patient location during evaluation: PACU Anesthesia Type: General Level of consciousness: awake and alert Pain management: pain level controlled Vital Signs Assessment: post-procedure vital signs reviewed and stable Respiratory status: spontaneous breathing, nonlabored ventilation, respiratory function stable and patient connected to nasal cannula oxygen Cardiovascular status: blood pressure returned to baseline and stable Postop Assessment: no apparent nausea or vomiting Anesthetic complications: no   No notable events documented.   Last Vitals:  Vitals:   01/22/22 0846 01/22/22 0858  BP: (!) 116/95 (!) 134/95  Pulse: 78 71  Resp: 18   Temp: (!) 36.4 C (!) 36.4 C  SpO2: 98% 99%    Last Pain:  Vitals:   01/22/22 0858  TempSrc:   PainSc: 0-No pain                 Martha Clan

## 2022-01-22 NOTE — Op Note (Signed)
[facility] Gastroenterology Patient Name: Arthur Stevens Procedure Date: 01/22/2022 8:03 AM MRN: [id number] Account #: [id number] Date of Birth: Jun 19, 1975 Admit Type: Outpatient Age: 46 Room: [facility] OR ROOM 01 Gender: Male Note Status: Finalized Instrument Name: [id number] Procedure:             Colonoscopy Indications:           Screening for colorectal malignant neoplasm Providers:             Lin Landsman MD, MD Referring MD:          Lin Landsman MD, MD (Referring MD), Pleas Koch (Referring MD) Medicines:             General Anesthesia Complications:         No immediate complications. Estimated blood loss: None. Procedure:             Pre-Anesthesia Assessment:                        - Prior to the procedure, a History and Physical was                         performed, and patient medications and allergies were                         reviewed. The patient is competent. The risks and                         benefits of the procedure and the sedation options and                         risks were discussed with the patient. All questions                         were answered and informed consent was obtained.                         Patient identification and proposed procedure were                         verified by the physician, the nurse, the                         anesthesiologist, the anesthetist and the technician                         in the pre-procedure area in the procedure room in the                         endoscopy suite. Mental Status Examination: alert and                         oriented. Airway Examination: normal oropharyngeal                         airway and neck mobility. Respiratory Examination:  clear to auscultation. CV Examination: normal.                         Prophylactic Antibiotics: The patient does not require                         prophylactic antibiotics.  Prior Anticoagulants: The                         patient has taken no previous anticoagulant or                         antiplatelet agents. ASA Grade Assessment: II - A                         patient with mild systemic disease. After reviewing                         the risks and benefits, the patient was deemed in                         satisfactory condition to undergo the procedure. The                         anesthesia plan was to use general anesthesia.                         Immediately prior to administration of medications,                         the patient was re-assessed for adequacy to receive                         sedatives. The heart rate, respiratory rate, oxygen                         saturations, blood pressure, adequacy of pulmonary                         ventilation, and response to care were monitored                         throughout the procedure. The physical status of the                         patient was re-assessed after the procedure.                        After obtaining informed consent, the colonoscope was                         passed under direct vision. Throughout the procedure,                         the patient's blood pressure, pulse, and oxygen                         saturations were monitored continuously. The  Colonoscope was introduced through the anus and                         advanced to the the cecum, identified by appendiceal                         orifice and ileocecal valve. The colonoscopy was                         performed without difficulty. The patient tolerated                         the procedure well. The quality of the bowel                         preparation was evaluated using the BBPS [facility] Bowel                         Preparation Scale) with scores of: Right Colon = 3,                         Transverse Colon = 3 and Left Colon = 3 (entire mucosa                         seen well  with no residual staining, small fragments                         of stool or opaque liquid). The total BBPS score                         equals 9. Findings:      The perianal and digital rectal examinations were normal. Pertinent       negatives include normal sphincter tone and no palpable rectal lesions.      A diminutive polyp was found in the descending colon. The polyp was       sessile. The polyp was removed with a cold biopsy forceps. Resection and       retrieval were complete.      A 6 mm polyp was found in the sigmoid colon. The polyp was sessile. The       polyp was removed with a cold snare. Resection and retrieval were       complete.      The retroflexed view of the distal rectum and anal verge was normal and       showed no anal or rectal abnormalities. Impression:            - One diminutive polyp in the descending colon,                         removed with a cold biopsy forceps. Resected and                         retrieved.                        - One 6 mm polyp in the sigmoid colon, removed with a  cold snare. Resected and retrieved.                        - The distal rectum and anal verge are normal on                         retroflexion view. Recommendation:        - Discharge patient to home (with escort).                        - Resume previous diet today.                        - Continue present medications.                        - Await pathology results.                        - Repeat colonoscopy in 5 to 7 years for surveillance                         based on pathology results. Procedure Code(s):     --- Professional ---                        [phone], Colonoscopy, flexible; with removal of                         tumor(s), polyp(s), or other lesion(s) by snare                         technique                        45380, 15, Colonoscopy, flexible; with biopsy, single                         or multiple Diagnosis Code(s):      --- Professional ---                        Z12.11, Encounter for screening for malignant neoplasm                         of colon                        K63.5, Polyp of colon CPT copyright 2019 American Medical Association. All rights reserved. The codes documented in this report are preliminary and upon coder review may  be revised to meet current compliance requirements. Dr. Ulyess Mort Lin Landsman MD, MD 01/22/2022 8:45:06 AM This report has been signed electronically. Number of Addenda: 0 Note Initiated On: 01/22/2022 8:03 AM Scope Withdrawal Time: 0 hours 11 minutes 45 seconds  Total Procedure Duration: 0 hours 15 minutes 10 seconds  Estimated Blood Loss:  Estimated blood loss: none.      [facility]

## 2022-01-23 ENCOUNTER — Encounter: Payer: Self-pay | Admitting: Gastroenterology

## 2022-01-26 ENCOUNTER — Encounter: Payer: Self-pay | Admitting: Gastroenterology

## 2022-01-26 LAB — SURGICAL PATHOLOGY

## 2022-02-19 ENCOUNTER — Other Ambulatory Visit: Payer: BC Managed Care – PPO

## 2022-03-03 ENCOUNTER — Other Ambulatory Visit: Payer: Self-pay | Admitting: Gastroenterology

## 2022-03-03 DIAGNOSIS — K2210 Ulcer of esophagus without bleeding: Secondary | ICD-10-CM

## 2022-03-12 ENCOUNTER — Other Ambulatory Visit: Payer: Self-pay | Admitting: Gastroenterology

## 2022-03-12 DIAGNOSIS — K2210 Ulcer of esophagus without bleeding: Secondary | ICD-10-CM

## 2022-05-04 ENCOUNTER — Other Ambulatory Visit: Payer: Self-pay

## 2022-05-04 ENCOUNTER — Ambulatory Visit: Payer: BC Managed Care – PPO | Admitting: Gastroenterology

## 2022-05-04 ENCOUNTER — Encounter: Payer: Self-pay | Admitting: Gastroenterology

## 2022-05-04 VITALS — BP 149/85 | HR 78 | Temp 98.4°F | Ht 72.0 in | Wt 231.2 lb

## 2022-05-04 DIAGNOSIS — D3610 Benign neoplasm of peripheral nerves and autonomic nervous system, unspecified: Secondary | ICD-10-CM | POA: Insufficient documentation

## 2022-05-04 DIAGNOSIS — K2210 Ulcer of esophagus without bleeding: Secondary | ICD-10-CM

## 2022-05-04 DIAGNOSIS — D2230 Melanocytic nevi of unspecified part of face: Secondary | ICD-10-CM | POA: Insufficient documentation

## 2022-05-04 NOTE — Progress Notes (Signed)
Arthur Darby, MD [address]  [place]  [place], [place] [address]  Main: [phone]  Fax: [phone]    Gastroenterology Consultation  Referring Provider:     Pleas Koch, NP Primary Care Physician:  Pleas Koch, NP Primary Gastroenterologist:  Dr. Cephas Stevens Reason for Consultation: Dysphagia, erosive esophagitis        HPI:   Arthur Stevens is a 47 y.o. male referred by Dr. Carlis Abbott, Leticia Penna, NP  for consultation & management of dysphagia.  Patient was diagnosed with severe erosive esophagitis based on the upper endoscopy in 06/2020.  He was on 3 months course of omeprazole 40 mg p.o. twice daily which resulted in complete resolution of his symptoms.  Later, switched to H2 blocker as needed.  He was supposed to have a follow-up endoscopy, did not undergo yet pending if the repeat procedure would be covered by his insurance.  He is also due for his colonoscopy due to fair prep.  Patient reports that lately has been experiencing return of difficulty swallowing, food getting stuck associated with regurgitation, especially hard meats.  He does report occasional heartburn only.  He drinks 2 cups of coffee, 2 cans of Diet Coke daily, does drink up to 5-6 beers from Thursday through Sunday regularly.  Follow-up visit 05/04/2022 Patient is here for follow-up of erosive esophagitis.  He underwent EGD in October 2023, found to have persistent erosive esophagitis.  He took omeprazole 40 mg twice daily for 1 month followed by once a day.  Currently taking once a day.  He reports that his symptoms are under control.  He is eating more salads per week, cut back on alcohol, switched to hot tea  Patient does not smoke or vape  NSAIDs: None  Antiplts/Anticoagulants/Anti thrombotics: None  GI Procedures:   EGD and colonoscopy 01/22/2022 EGD revealed LA grade C esophagitis Small hiatal hernia Normal stomach and duodenum  Excellent bowel prep 2 subcentimeter  polyps, removed  DIAGNOSIS:  A. ESOPHAGUS, RANDOM; BIOPSY:  - SQUAMOUS MUCOSA WITH CHANGES CONSISTENT WITH REFLUX.  - NEGATIVE FOR INTRAEPITHELIAL EOSINOPHILS, DYSPLASIA, AND MALIGNANCY.   B.  COLON POLYP, DESCENDING; BIOPSY:  - HYPERPLASTIC POLYP.  - NEGATIVE FOR DYSPLASIA AND MALIGNANCY.   C.  COLON POLYP, SIGMOID; COLD SNARE:  - TUBULAR ADENOMA.  - NEGATIVE FOR HIGH-GRADE DYSPLASIA AND MALIGNANCY.    EGD and colonoscopy 07/03/2020 - LA Grade C reflux esophagitis with no bleeding. Biopsied. - Esophageal ulcer with no bleeding and no stigmata of recent bleeding. - Nodular mucosa in the esophagus. Biopsied. - Erythematous mucosa in the antrum. Biopsied. - 2 cm hiatal hernia. - Erythematous duodenopathy. - Biopsies were obtained in the gastric body, at the incisura and in the gastric antrum.  - Preparation of the colon was fair. - The rectum, sigmoid colon, descending colon, transverse colon, ascending colon and cecum are normal. - The distal rectum and anal verge are normal on retroflexion view. - No specimens collected.  DIAGNOSIS:  A. STOMACH; COLD BIOPSY:  - CHANGES SUGGESTIVE OF HEALING MUCOSAL INJURY.  - NEGATIVE FOR ACTIVE INFLAMMATION AND H PYLORI.  - NEGATIVE FOR INTESTINAL METAPLASIA, DYSPLASIA, AND MALIGNANCY.   B. ESOPHAGUS, THICKENING; COLD BIOPSY:  - SQUAMOCOLUMNAR JUNCTION WITH REACTIVE FOVEOLAR HYPERPLASIA AND  FEATURES SUGGESTIVE OF REFLUX.  - NEGATIVE FOR INTESTINAL METAPLASIA, DYSPLASIA, AND MALIGNANCY.   C. ESOPHAGUS, DISTAL; COLD BIOPSY:  - SQUAMOCOLUMNAR JUNCTION WITH NO SIGNIFICANT PATHOLOGIC ALTERATION.  - NEGATIVE FOR INCREASED EOSINOPHILS.  -  NEGATIVE FOR INTESTINAL METAPLASIA, DYSPLASIA, AND MALIGNANCY.   D. ESOPHAGUS, PROXIMAL; COLD BIOPSY:  - SQUAMOUS MUCOSA WITH NO SIGNIFICANT PATHOLOGIC ALTERATION.  - NEGATIVE FOR INCREASED EOSINOPHILS.  - NEGATIVE FOR INTESTINAL METAPLASIA, DYSPLASIA, AND MALIGNANCY.   Past Medical History:  Diagnosis  Date   Allergy    Anxiety    Hyperlipidemia    Hypertension     Past Surgical History:  Procedure Laterality Date   COLONOSCOPY WITH PROPOFOL N/A 07/03/2020   Procedure: COLONOSCOPY WITH PROPOFOL;  Surgeon: Virgel Manifold, MD;  Location: [facility];  Service: Endoscopy;  Laterality: N/A;   COLONOSCOPY WITH PROPOFOL N/A 01/22/2022   Procedure: COLONOSCOPY WITH PROPOFOL;  Surgeon: Lin Landsman, MD;  Location: [place];  Service: Endoscopy;  Laterality: N/A;   ESOPHAGOGASTRODUODENOSCOPY (EGD) WITH PROPOFOL N/A 07/03/2020   Procedure: ESOPHAGOGASTRODUODENOSCOPY (EGD) WITH PROPOFOL;  Surgeon: Virgel Manifold, MD;  Location: [facility];  Service: Endoscopy;  Laterality: N/A;   ESOPHAGOGASTRODUODENOSCOPY (EGD) WITH PROPOFOL N/A 01/22/2022   Procedure: ESOPHAGOGASTRODUODENOSCOPY (EGD) WITH PROPOFOL;  Surgeon: Lin Landsman, MD;  Location: [place];  Service: Endoscopy;  Laterality: N/A;   POLYPECTOMY  01/22/2022   Procedure: POLYPECTOMY;  Surgeon: Lin Landsman, MD;  Location: [place];  Service: Endoscopy;;     Current Outpatient Medications:    Ascorbic Acid (VITAMIN C) 100 MG tablet, Take 100 mg by mouth daily., Disp: , Rfl:    atorvastatin (LIPITOR) 40 MG tablet, Take 1 tablet (40 mg total) by mouth daily. for cholesterol., Disp: 90 tablet, Rfl: 3   cetirizine (ZYRTEC) 10 MG tablet, Take 10 mg by mouth daily., Disp: , Rfl:    citalopram (CELEXA) 10 MG tablet, Take 1 tablet (10 mg total) by mouth daily. For anxiety, Disp: 90 tablet, Rfl: 3   diphenhydramine-acetaminophen (TYLENOL PM) 25-500 MG TABS, Take 1 tablet by mouth at bedtime as needed., Disp: , Rfl:    lisinopril (ZESTRIL) 10 MG tablet, TAKE 1 TABLET BY MOUTH EVERY DAY FOR BLOOD PRESSURE, Disp: 90 tablet, Rfl: 3   Melatonin 5 MG CHEW, Chew by mouth at bedtime as needed., Disp: , Rfl:    Multiple Vitamin (MULTIVITAMIN) tablet, Take 1 tablet by mouth daily., Disp: ,  Rfl:    omeprazole (PRILOSEC) 40 MG capsule, TAKE 1 CAPSULE BY MOUTH EVERY DAY BEFORE BREAKFAST, Disp: 90 capsule, Rfl: 0   ondansetron (ZOFRAN) 4 MG tablet, Take 1 tablet (4 mg total) by mouth every 8 (eight) hours as needed for nausea or vomiting., Disp: 10 tablet, Rfl: 0   VITAMIN D PO, Take by mouth daily., Disp: , Rfl:    zinc gluconate 50 MG tablet, Take 50 mg by mouth daily., Disp: , Rfl:    Family History  Problem Relation Age of Onset   Hyperlipidemia Mother    Hypertension Mother    Cancer Sister      Social History   Tobacco Use   Smoking status: Never   Smokeless tobacco: Never  Vaping Use   Vaping Use: Never used  Substance Use Topics   Alcohol use: Yes    Alcohol/week: 15.0 standard drinks of alcohol    Types: 15 Cans of beer per week    Comment: 5-6 beers on F, Sa &Su   Drug use: No    Allergies as of 05/04/2022   (No Known Allergies)    Review of Systems:    All systems reviewed and negative except where noted in HPI.   Physical Exam:  BP Marland Kitchen)  149/85 (BP Location: Left Arm, Patient Position: Sitting, Cuff Size: Normal)   Pulse 78   Temp 98.4 F (36.9 C) (Oral)   Ht 6' (1.829 m)   Wt 231 lb 4 oz (104.9 kg)   BMI 31.36 kg/m  No LMP for male patient.  General:   Alert,  Well-developed, well-nourished, pleasant and cooperative in NAD Head:  Normocephalic and atraumatic. Eyes:  Sclera clear, no icterus.   Conjunctiva pink. Ears:  Normal auditory acuity. Nose:  No deformity, discharge, or lesions. Mouth:  No deformity or lesions,oropharynx pink & moist. Neck:  Supple; no masses or thyromegaly. Lungs:  Respirations even and unlabored.  Clear throughout to auscultation.   No wheezes, crackles, or rhonchi. No acute distress. Heart:  Regular rate and rhythm; no murmurs, clicks, rubs, or gallops. Abdomen:  Normal bowel sounds. Soft, non-tender and non-distended without masses, hepatosplenomegaly or hernias noted.  No guarding or rebound tenderness.    Rectal: Not performed Msk:  Symmetrical without gross deformities. Good, equal movement & strength bilaterally. Pulses:  Normal pulses noted. Extremities:  No clubbing or edema.  No cyanosis. Neurologic:  Alert and oriented x3;  grossly normal neurologically. Skin:  Intact without significant lesions or rashes. No jaundice. Psych:  Alert and cooperative. Normal mood and affect.  Imaging Studies: Reviewed  Assessment and Plan:   Arthur Stevens is a 47 y.o. pleasant Caucasian male with history of hypertension, hyperlipidemia, LA grade C esophagitis is seen in for follow-up of erosive esophagitis, dysphagia Symptoms have currently resolved  Erosive esophagitis, recurrence of dysphagia: Symptoms have currently resolved Repeat EGD revealed LA grade C esophagitis, no evidence of eosinophilic esophagitis Continue omeprazole 40 mg once a day before breakfast Continue antireflux lifestyle Strongly reiterated on cutting back on carbonated beverages, coffee and alcohol use Recommend repeat EGD to evaluate healing of erosive esophagitis and rule out any Barrett's esophagus If persistent, will switch from omeprazole to vonoprazan   Follow up in 6 months   Arthur Darby, MD

## 2022-05-19 ENCOUNTER — Telehealth: Payer: Self-pay | Admitting: Gastroenterology

## 2022-05-19 NOTE — Telephone Encounter (Signed)
Patient states he took his multivitamin last night. He forgot not to take it. He states he will not take it tonight or in the morning

## 2022-05-19 NOTE — Telephone Encounter (Signed)
Pt called about coming up procedure and was wondering should he stop taking his vitamins [phone]

## 2022-05-19 NOTE — Telephone Encounter (Signed)
error 

## 2022-05-20 ENCOUNTER — Encounter: Payer: Self-pay | Admitting: Gastroenterology

## 2022-05-20 ENCOUNTER — Ambulatory Visit: Payer: BC Managed Care – PPO | Admitting: Anesthesiology

## 2022-05-20 ENCOUNTER — Other Ambulatory Visit: Payer: Self-pay

## 2022-05-20 ENCOUNTER — Encounter
Admission: RE | Disposition: A | Discharge status: Home / Self Care | Payer: Self-pay | Source: Home / Self Care | Attending: Gastroenterology

## 2022-05-20 ENCOUNTER — Ambulatory Visit
Admission: RE | Admit: 2022-05-20 | Discharge: 2022-05-20 | Disposition: A | Discharge status: Home / Self Care | Payer: BC Managed Care – PPO | Attending: Gastroenterology | Admitting: Gastroenterology

## 2022-05-20 DIAGNOSIS — K449 Diaphragmatic hernia without obstruction or gangrene: Secondary | ICD-10-CM

## 2022-05-20 DIAGNOSIS — E669 Obesity, unspecified: Secondary | ICD-10-CM | POA: Diagnosis not present

## 2022-05-20 DIAGNOSIS — K2100 Gastro-esophageal reflux disease with esophagitis, without bleeding: Secondary | ICD-10-CM | POA: Insufficient documentation

## 2022-05-20 DIAGNOSIS — I10 Essential (primary) hypertension: Secondary | ICD-10-CM | POA: Diagnosis not present

## 2022-05-20 DIAGNOSIS — Z09 Encounter for follow-up examination after completed treatment for conditions other than malignant neoplasm: Secondary | ICD-10-CM | POA: Insufficient documentation

## 2022-05-20 DIAGNOSIS — K2210 Ulcer of esophagus without bleeding: Secondary | ICD-10-CM

## 2022-05-20 DIAGNOSIS — Z8711 Personal history of peptic ulcer disease: Secondary | ICD-10-CM | POA: Diagnosis not present

## 2022-05-20 DIAGNOSIS — Z6830 Body mass index (BMI) 30.0-30.9, adult: Secondary | ICD-10-CM | POA: Diagnosis not present

## 2022-05-20 DIAGNOSIS — E785 Hyperlipidemia, unspecified: Secondary | ICD-10-CM | POA: Diagnosis not present

## 2022-05-20 HISTORY — PX: ESOPHAGOGASTRODUODENOSCOPY (EGD) WITH PROPOFOL: SHX5813

## 2022-05-20 SURGERY — ESOPHAGOGASTRODUODENOSCOPY (EGD) WITH PROPOFOL
Anesthesia: General

## 2022-05-20 MED ORDER — PROPOFOL 10 MG/ML IV BOLUS
INTRAVENOUS | Status: AC
  Filled 2022-05-20: qty 40

## 2022-05-20 MED ORDER — PROPOFOL 10 MG/ML IV BOLUS
INTRAVENOUS | Status: DC | PRN
  Administered 2022-05-20: 60 mg via INTRAVENOUS

## 2022-05-20 MED ORDER — LIDOCAINE HCL (CARDIAC) PF 100 MG/5ML IV SOSY
PREFILLED_SYRINGE | INTRAVENOUS | Status: DC | PRN
  Administered 2022-05-20: 50 mg via INTRAVENOUS

## 2022-05-20 MED ORDER — PROPOFOL 500 MG/50ML IV EMUL
INTRAVENOUS | Status: DC | PRN
  Administered 2022-05-20: 175 ug/kg/min via INTRAVENOUS

## 2022-05-20 MED ORDER — SODIUM CHLORIDE 0.9 % IV SOLN: INTRAVENOUS | Status: DC

## 2022-05-20 NOTE — Op Note (Signed)
[facility] Gastroenterology Patient Name: Arthur Stevens Procedure Date: 05/20/2022 8:20 AM MRN: [id number] Account #: [id number] Date of Birth: [birth year] Admit Type: Outpatient Age: 47 Room: [facility] ENDO ROOM 4 Gender: Male Note Status: Finalized Instrument Name: Upper Endoscope [id number] Procedure:             Upper GI endoscopy Indications:           Follow-up of esophagitis, Follow-up of reflux                         esophagitis Providers:             Lin Landsman MD, MD Referring MD:          Pleas Koch (Referring MD) Medicines:             General Anesthesia Complications:         No immediate complications. Estimated blood loss: None. Procedure:             Pre-Anesthesia Assessment:                        - Prior to the procedure, a History and Physical was                         performed, and patient medications and allergies were                         reviewed. The patient is competent. The risks and                         benefits of the procedure and the sedation options and                         risks were discussed with the patient. All questions                         were answered and informed consent was obtained.                         Patient identification and proposed procedure were                         verified by the physician, the nurse, the                         anesthesiologist, the anesthetist and the technician                         in the pre-procedure area in the procedure room in the                         endoscopy suite. Mental Status Examination: alert and                         oriented. Airway Examination: normal oropharyngeal                         airway and neck mobility. Respiratory Examination:  clear to auscultation. CV Examination: normal.                         Prophylactic Antibiotics: The patient does not require                         prophylactic antibiotics.  Prior Anticoagulants: The                         patient has taken no anticoagulant or antiplatelet                         agents. ASA Grade Assessment: II - A patient with mild                         systemic disease. After reviewing the risks and                         benefits, the patient was deemed in satisfactory                         condition to undergo the procedure. The anesthesia                         plan was to use general anesthesia. Immediately prior                         to administration of medications, the patient was                         re-assessed for adequacy to receive sedatives. The                         heart rate, respiratory rate, oxygen saturations,                         blood pressure, adequacy of pulmonary ventilation, and                         response to care were monitored throughout the                         procedure. The physical status of the patient was                         re-assessed after the procedure.                        After obtaining informed consent, the endoscope was                         passed under direct vision. Throughout the procedure,                         the patient's blood pressure, pulse, and oxygen                         saturations were monitored continuously. The Endoscope  was introduced through the mouth, and advanced to the                         second part of duodenum. The upper GI endoscopy was                         accomplished without difficulty. The patient tolerated                         the procedure well. Findings:      The duodenal bulb and second portion of the duodenum were normal.      The entire examined stomach was normal.      The cardia and gastric fundus were normal on retroflexion.      A small hiatal hernia was present.      The gastroesophageal junction and examined esophagus were normal. Impression:            - Normal duodenal bulb and second  portion of the                         duodenum.                        - Normal stomach.                        - Small hiatal hernia.                        - Normal gastroesophageal junction and esophagus.                        - No specimens collected. Recommendation:        - Discharge patient to home (with escort).                        - Resume previous diet today.                        - Continue present medications.                        - Follow an antireflux regimen indefinitely. Procedure Code(s):     --- Professional ---                        [phone], Esophagogastroduodenoscopy, flexible,                         transoral; diagnostic, including collection of                         specimen(s) by brushing or washing, when performed                         (separate procedure) Diagnosis Code(s):     --- Professional ---                        K44.9, Diaphragmatic hernia without obstruction or  gangrene                        K21.00, Gastro-esophageal reflux disease with                         esophagitis, without bleeding CPT copyright 2022 American Medical Association. All rights reserved. The codes documented in this report are preliminary and upon coder review may  be revised to meet current compliance requirements. Dr. Ulyess Mort Lin Landsman MD, MD 05/20/2022 8:38:34 AM This report has been signed electronically. Number of Addenda: 0 Note Initiated On: 05/20/2022 8:20 AM Estimated Blood Loss:  Estimated blood loss: none.      [facility]

## 2022-05-20 NOTE — Transfer of Care (Signed)
Immediate Anesthesia Transfer of Care Note  Patient: Arthur Stevens  Procedure(s) Performed: ESOPHAGOGASTRODUODENOSCOPY (EGD) WITH PROPOFOL  Patient Location: PACU  Anesthesia Type:MAC  Level of Consciousness: awake  Airway & Oxygen Therapy: Patient Spontanous Breathing and Patient connected to nasal cannula oxygen  Post-op Assessment: Report given to RN and Post -op Vital signs reviewed and stable  Post vital signs: Reviewed and stable  Last Vitals:  Vitals Value Taken Time  BP    Temp    Pulse    Resp    SpO2      Last Pain:  Vitals:   05/20/22 0737  TempSrc: Temporal  PainSc: 0-No pain         Complications: No notable events documented.

## 2022-05-20 NOTE — Anesthesia Preprocedure Evaluation (Signed)
Anesthesia Evaluation  Patient identified by MRN, date of birth, ID band Patient awake    Reviewed: Allergy & Precautions, NPO status , Patient's Chart, lab work & pertinent test results  Airway Mallampati: II  TM Distance: >3 FB Neck ROM: Full    Dental  (+) Teeth Intact   Pulmonary neg pulmonary ROS   Pulmonary exam normal breath sounds clear to auscultation       Cardiovascular Exercise Tolerance: Good hypertension, Pt. on medications negative cardio ROS Normal cardiovascular exam Rhythm:Regular Rate:Normal     Neuro/Psych   Anxiety     negative neurological ROS  negative psych ROS   GI/Hepatic negative GI ROS, Neg liver ROS, hiatal hernia, PUD,GERD  Medicated,,  Endo/Other  negative endocrine ROS    Renal/GU negative Renal ROS  negative genitourinary   Musculoskeletal negative musculoskeletal ROS (+)    Abdominal Normal abdominal exam  (+)   Peds negative pediatric ROS (+)  Hematology negative hematology ROS (+)   Anesthesia Other Findings Past Medical History: No date: Allergy No date: Anxiety No date: Hyperlipidemia No date: Hypertension  Past Surgical History: 07/03/2020: COLONOSCOPY WITH PROPOFOL; N/A     Comment:  Procedure: COLONOSCOPY WITH PROPOFOL;  Surgeon:               Virgel Manifold, MD;  Location: [facility];                Service: Endoscopy;  Laterality: N/A; 01/22/2022: COLONOSCOPY WITH PROPOFOL; N/A     Comment:  Procedure: COLONOSCOPY WITH PROPOFOL;  Surgeon: Lin Landsman, MD;  Location: [place];                Service: Endoscopy;  Laterality: N/A; 07/03/2020: ESOPHAGOGASTRODUODENOSCOPY (EGD) WITH PROPOFOL; N/A     Comment:  Procedure: ESOPHAGOGASTRODUODENOSCOPY (EGD) WITH               PROPOFOL;  Surgeon: Virgel Manifold, MD;  Location:               [facility];  Service: Endoscopy;  Laterality: N/A; 01/22/2022:  ESOPHAGOGASTRODUODENOSCOPY (EGD) WITH PROPOFOL; N/A     Comment:  Procedure: ESOPHAGOGASTRODUODENOSCOPY (EGD) WITH               PROPOFOL;  Surgeon: Lin Landsman, MD;  Location:               [place];  Service: Endoscopy;  Laterality:               N/A; 01/22/2022: POLYPECTOMY     Comment:  Procedure: POLYPECTOMY;  Surgeon: Lin Landsman,               MD;  Location: [place];  Service: Endoscopy;;  BMI    Body Mass Index: 30.92 kg/m      Reproductive/Obstetrics negative OB ROS                             Anesthesia Physical Anesthesia Plan  ASA: 2  Anesthesia Plan: General   Post-op Pain Management:    Induction: Intravenous  PONV Risk Score and Plan: Propofol infusion and TIVA  Airway Management Planned: Natural Airway and Nasal Cannula  Additional Equipment:   Intra-op Plan:   Post-operative Plan:   Informed Consent: I have reviewed the patients History and Physical, chart, labs and  discussed the procedure including the risks, benefits and alternatives for the proposed anesthesia with the patient or authorized representative who has indicated his/her understanding and acceptance.     Dental Advisory Given  Plan Discussed with: CRNA and Surgeon  Anesthesia Plan Comments:        Anesthesia Quick Evaluation

## 2022-05-20 NOTE — H&P (Signed)
Cephas Darby, MD [address]  [place]  [place], [place] [address]  Main: [phone]  Fax: [phone] Pager: [phone]  Primary Care Physician:  Pleas Koch, NP Primary Gastroenterologist:  Dr. Cephas Darby  Pre-Procedure History & Physical: HPI:  Arthur Stevens is a 47 y.o. male is here for an endoscopy.   Past Medical History:  Diagnosis Date   Allergy    Anxiety    Hyperlipidemia    Hypertension     Past Surgical History:  Procedure Laterality Date   COLONOSCOPY WITH PROPOFOL N/A 07/03/2020   Procedure: COLONOSCOPY WITH PROPOFOL;  Surgeon: Virgel Manifold, MD;  Location: [facility];  Service: Endoscopy;  Laterality: N/A;   COLONOSCOPY WITH PROPOFOL N/A 01/22/2022   Procedure: COLONOSCOPY WITH PROPOFOL;  Surgeon: Lin Landsman, MD;  Location: [place];  Service: Endoscopy;  Laterality: N/A;   ESOPHAGOGASTRODUODENOSCOPY (EGD) WITH PROPOFOL N/A 07/03/2020   Procedure: ESOPHAGOGASTRODUODENOSCOPY (EGD) WITH PROPOFOL;  Surgeon: Virgel Manifold, MD;  Location: [facility];  Service: Endoscopy;  Laterality: N/A;   ESOPHAGOGASTRODUODENOSCOPY (EGD) WITH PROPOFOL N/A 01/22/2022   Procedure: ESOPHAGOGASTRODUODENOSCOPY (EGD) WITH PROPOFOL;  Surgeon: Lin Landsman, MD;  Location: [place];  Service: Endoscopy;  Laterality: N/A;   POLYPECTOMY  01/22/2022   Procedure: POLYPECTOMY;  Surgeon: Lin Landsman, MD;  Location: [place];  Service: Endoscopy;;    Prior to Admission medications   Medication Sig Start Date End Date Taking? Authorizing Provider  atorvastatin (LIPITOR) 40 MG tablet Take 1 tablet (40 mg total) by mouth daily. for cholesterol. 11/11/21  Yes Pleas Koch, NP  lisinopril (ZESTRIL) 10 MG tablet TAKE 1 TABLET BY MOUTH EVERY DAY FOR BLOOD PRESSURE 12/13/21  Yes Pleas Koch, NP  zinc gluconate 50 MG tablet Take 50 mg by mouth daily.   Yes [provider]  Ascorbic Acid  (VITAMIN C) 100 MG tablet Take 100 mg by mouth daily.    [provider]  cetirizine (ZYRTEC) 10 MG tablet Take 10 mg by mouth daily.    [provider]  citalopram (CELEXA) 10 MG tablet Take 1 tablet (10 mg total) by mouth daily. For anxiety 11/21/21   Pleas Koch, NP  diphenhydramine-acetaminophen (TYLENOL PM) 25-500 MG TABS Take 1 tablet by mouth at bedtime as needed.    [provider]  Melatonin 5 MG CHEW Chew by mouth at bedtime as needed.    [provider]  Multiple Vitamin (MULTIVITAMIN) tablet Take 1 tablet by mouth daily.    [provider]  omeprazole (PRILOSEC) 40 MG capsule TAKE 1 CAPSULE BY MOUTH EVERY DAY BEFORE BREAKFAST 03/03/22   Kristian Hazzard, Tally Due, MD  ondansetron (ZOFRAN) 4 MG tablet Take 1 tablet (4 mg total) by mouth every 8 (eight) hours as needed for nausea or vomiting. 12/31/21   Denaly Gatling, Tally Due, MD  VITAMIN D PO Take by mouth daily.    [provider]    Allergies as of 05/04/2022   (No Known Allergies)    Family History  Problem Relation Age of Onset   Hyperlipidemia Mother    Hypertension Mother    Cancer Sister     Social History   Socioeconomic History   Marital status: Married    Spouse name: Not on file   Number of children: Not on file   Years of education: Not on file   Highest education level: Not on file  Occupational History   Not on file  Tobacco Use   Smoking status: Never   Smokeless tobacco: Never  Vaping Use   Vaping Use: Never used  Substance and Sexual Activity   Alcohol use: Yes    Alcohol/week: 15.0 standard drinks of alcohol    Types: 15 Cans of beer per week    Comment: 5-6 beers on F, Sa &Su   Drug use: No   Sexual activity: Yes    Birth control/protection: None  Other Topics Concern   Not on file  Social History Narrative   Married.   2 children.   Works as a Financial risk analyst.   Enjoys spending time with children, playing golf   Social Determinants of  Health   Financial Resource Strain: Not on file  Food Insecurity: Not on file  Transportation Needs: Not on file  Physical Activity: Not on file  Stress: Not on file  Social Connections: Not on file  Intimate Partner Violence: Not on file    Review of Systems: See HPI, otherwise negative ROS  Physical Exam: BP (!) 136/90   Pulse 64   Temp (!) 96.7 F (35.9 C) (Temporal)   Resp 18   Ht 6' (1.829 m)   Wt 103.4 kg   SpO2 98%   BMI 30.92 kg/m  General:   Alert,  pleasant and cooperative in NAD Head:  Normocephalic and atraumatic. Neck:  Supple; no masses or thyromegaly. Lungs:  Clear throughout to auscultation.    Heart:  Regular rate and rhythm. Abdomen:  Soft, nontender and nondistended. Normal bowel sounds, without guarding, and without rebound.   Neurologic:  Alert and  oriented x4;  grossly normal neurologically.  Impression/Plan: Arthur Stevens is here for an endoscopy to be performed for h/o erosive esophagitis  Risks, benefits, limitations, and alternatives regarding  endoscopy have been reviewed with the patient.  Questions have been answered.  All parties agreeable.   Sherri Sear, MD  05/20/2022, 7:41 AM

## 2022-05-20 NOTE — Anesthesia Postprocedure Evaluation (Signed)
Anesthesia Post Note  Patient: Arthur Stevens  Procedure(s) Performed: ESOPHAGOGASTRODUODENOSCOPY (EGD) WITH PROPOFOL  Patient location during evaluation: PACU Anesthesia Type: General Level of consciousness: awake and awake and alert Pain management: pain level controlled Respiratory status: spontaneous breathing and nonlabored ventilation Cardiovascular status: stable Anesthetic complications: no   No notable events documented.   Last Vitals:  Vitals:   05/20/22 0737 05/20/22 0840  BP: (!) 136/90 132/84  Pulse: 64 68  Resp: 18 11  Temp: (!) 35.9 C (!) 36.1 C  SpO2: 98% 98%    Last Pain:  Vitals:   05/20/22 0840  TempSrc: Temporal  PainSc: 0-No pain                 VAN STAVEREN,Akanksha Bellmore

## 2022-05-21 ENCOUNTER — Encounter: Payer: Self-pay | Admitting: Gastroenterology

## 2022-08-25 ENCOUNTER — Other Ambulatory Visit: Payer: Self-pay | Admitting: Gastroenterology

## 2022-08-25 DIAGNOSIS — K2210 Ulcer of esophagus without bleeding: Secondary | ICD-10-CM

## 2022-08-25 NOTE — Telephone Encounter (Addendum)
Last office visit 05/04/2022  Last refill 03/03/2022 0 refills 90 capsule  EGD 05/20/2022 repeat in 3 months

## 2022-08-25 NOTE — Telephone Encounter (Signed)
Think this patient should have had Lipid panel in November for follow up? Ok to schedule now? I sent patient message to see if he has been taking atorvastatin 40mg 

## 2022-09-04 ENCOUNTER — Other Ambulatory Visit: Payer: Self-pay | Admitting: Primary Care

## 2022-09-04 DIAGNOSIS — E785 Hyperlipidemia, unspecified: Secondary | ICD-10-CM

## 2022-10-14 ENCOUNTER — Other Ambulatory Visit: Payer: Self-pay | Admitting: Primary Care

## 2022-10-14 DIAGNOSIS — E785 Hyperlipidemia, unspecified: Secondary | ICD-10-CM

## 2022-10-14 NOTE — Telephone Encounter (Signed)
Patient is due for CPE/follow up in August, this will be required prior to any further refills.  Please schedule, thank you!   

## 2022-10-14 NOTE — Telephone Encounter (Signed)
LVMTCB

## 2022-10-30 ENCOUNTER — Other Ambulatory Visit: Payer: Self-pay | Admitting: Primary Care

## 2022-10-30 DIAGNOSIS — F411 Generalized anxiety disorder: Secondary | ICD-10-CM

## 2022-11-04 ENCOUNTER — Encounter (INDEPENDENT_AMBULATORY_CARE_PROVIDER_SITE_OTHER): Payer: Self-pay

## 2022-11-19 ENCOUNTER — Ambulatory Visit (INDEPENDENT_AMBULATORY_CARE_PROVIDER_SITE_OTHER): Payer: BC Managed Care – PPO | Admitting: Primary Care

## 2022-11-19 ENCOUNTER — Encounter: Payer: Self-pay | Admitting: Primary Care

## 2022-11-19 VITALS — BP 130/78 | HR 63 | Temp 97.6°F | Ht 71.0 in | Wt 235.0 lb

## 2022-11-19 DIAGNOSIS — Z0000 Encounter for general adult medical examination without abnormal findings: Secondary | ICD-10-CM | POA: Diagnosis not present

## 2022-11-19 DIAGNOSIS — I10 Essential (primary) hypertension: Secondary | ICD-10-CM | POA: Diagnosis not present

## 2022-11-19 DIAGNOSIS — K2210 Ulcer of esophagus without bleeding: Secondary | ICD-10-CM

## 2022-11-19 DIAGNOSIS — E785 Hyperlipidemia, unspecified: Secondary | ICD-10-CM | POA: Diagnosis not present

## 2022-11-19 DIAGNOSIS — R7303 Prediabetes: Secondary | ICD-10-CM | POA: Diagnosis not present

## 2022-11-19 DIAGNOSIS — F411 Generalized anxiety disorder: Secondary | ICD-10-CM | POA: Diagnosis not present

## 2022-11-19 LAB — COMPREHENSIVE METABOLIC PANEL
ALT: 37 U/L (ref 0–53)
AST: 23 U/L (ref 0–37)
Albumin: 4.4 g/dL (ref 3.5–5.2)
Alkaline Phosphatase: 55 U/L (ref 39–117)
BUN: 17 mg/dL (ref 6–23)
CO2: 29 mEq/L (ref 19–32)
Calcium: 9.5 mg/dL (ref 8.4–10.5)
Chloride: 101 mEq/L (ref 96–112)
Creatinine, Ser: 0.91 mg/dL (ref 0.40–1.50)
GFR: 99.58 mL/min (ref >60.00)
Glucose, Bld: 100 mg/dL — ABNORMAL HIGH (ref 70–99)
Potassium: 4.1 mEq/L (ref 3.5–5.1)
Sodium: 137 mEq/L (ref 135–145)
Total Bilirubin: 0.8 mg/dL (ref 0.2–1.2)
Total Protein: 7.1 g/dL (ref 6.0–8.3)

## 2022-11-19 LAB — LIPID PANEL
Cholesterol: 199 mg/dL (ref 0–200)
HDL: 45.1 mg/dL (ref >39.00)
NonHDL: 154.32
Total CHOL/HDL Ratio: 4
Triglycerides: 211 mg/dL — ABNORMAL HIGH (ref 0.0–149.0)
VLDL: 42.2 mg/dL — ABNORMAL HIGH (ref 0.0–40.0)

## 2022-11-19 LAB — HEMOGLOBIN A1C: Hgb A1c MFr Bld: 5.9 % (ref 4.6–6.5)

## 2022-11-19 LAB — LDL CHOLESTEROL, DIRECT: Direct LDL: 148 mg/dL

## 2022-11-19 NOTE — Assessment & Plan Note (Signed)
Repeat A1C pending.  Discussed the importance of a healthy diet and regular exercise in order for weight loss, and to reduce the risk of further co-morbidity.  

## 2022-11-19 NOTE — Progress Notes (Signed)
Subjective:    Patient ID: Arthur Stevens, male    DOB: [birth year], 47 y.o.   MRN: [id number]  HPI  Arthur Stevens is a very pleasant 47 y.o. male who presents today for complete physical and follow up of chronic conditions.  Immunizations: -Tetanus: Completed in 2015  Diet: Fair diet.  Exercise: No regular exercise.  Eye exam: Completes annually  Dental exam: Completes semi-annually    Colonoscopy: Completed in 2023, due 2030   BP Readings from Last 3 Encounters:  11/19/22 130/78  05/20/22 (!) 129/90  05/04/22 (!) 149/85       Review of Systems  Constitutional:  Negative for unexpected weight change.  HENT:  Negative for rhinorrhea.   Respiratory:  Negative for cough and shortness of breath.   Cardiovascular:  Negative for chest pain.  Gastrointestinal:  Negative for constipation and diarrhea.  Genitourinary:  Negative for difficulty urinating.  Musculoskeletal:  Negative for arthralgias and myalgias.  Skin:  Negative for rash.  Allergic/Immunologic: Negative for environmental allergies.  Neurological:  Negative for dizziness and headaches.  Psychiatric/Behavioral:  The patient is not nervous/anxious.          Past Medical History:  Diagnosis Date   Acute pain of right knee 04/05/2018   Allergy    Anxiety    Hyperlipidemia    Hypertension     Social History   Socioeconomic History   Marital status: Married    Spouse name: Not on file   Number of children: Not on file   Years of education: Not on file   Highest education level: Not on file  Occupational History   Not on file  Tobacco Use   Smoking status: Never   Smokeless tobacco: Never  Vaping Use   Vaping status: Never Used  Substance and Sexual Activity   Alcohol use: Yes    Alcohol/week: 15.0 standard drinks of alcohol    Types: 15 Cans of beer per week    Comment: 5-6 beers on F, Sa &Su   Drug use: No   Sexual activity: Yes    Birth control/protection: None  Other Topics Concern    Not on file  Social History Narrative   Married.   2 children.   Works as a Geographical information systems officer.   Enjoys spending time with children, playing golf   Social Determinants of Health   Financial Resource Strain: Not on file  Food Insecurity: Not on file  Transportation Needs: Not on file  Physical Activity: Not on file  Stress: Not on file  Social Connections: Not on file  Intimate Partner Violence: Not on file    Past Surgical History:  Procedure Laterality Date   COLONOSCOPY WITH PROPOFOL N/A 07/03/2020   Procedure: COLONOSCOPY WITH PROPOFOL;  Surgeon: Pasty Spillers, MD;  Location: [facility];  Service: Endoscopy;  Laterality: N/A;   COLONOSCOPY WITH PROPOFOL N/A 01/22/2022   Procedure: COLONOSCOPY WITH PROPOFOL;  Surgeon: Toney Reil, MD;  Location: [facility];  Service: Endoscopy;  Laterality: N/A;   ESOPHAGOGASTRODUODENOSCOPY (EGD) WITH PROPOFOL N/A 07/03/2020   Procedure: ESOPHAGOGASTRODUODENOSCOPY (EGD) WITH PROPOFOL;  Surgeon: Pasty Spillers, MD;  Location: [facility];  Service: Endoscopy;  Laterality: N/A;   ESOPHAGOGASTRODUODENOSCOPY (EGD) WITH PROPOFOL N/A 01/22/2022   Procedure: ESOPHAGOGASTRODUODENOSCOPY (EGD) WITH PROPOFOL;  Surgeon: Toney Reil, MD;  Location: [facility];  Service: Endoscopy;  Laterality: N/A;   ESOPHAGOGASTRODUODENOSCOPY (EGD) WITH PROPOFOL N/A 05/20/2022   Procedure: ESOPHAGOGASTRODUODENOSCOPY (EGD) WITH PROPOFOL;  Surgeon: Allegra Lai,  Loel Dubonnet, MD;  Location: [facility];  Service: Gastroenterology;  Laterality: N/A;   POLYPECTOMY  01/22/2022   Procedure: POLYPECTOMY;  Surgeon: Toney Reil, MD;  Location: [facility];  Service: Endoscopy;;    Family History  Problem Relation Age of Onset   Hyperlipidemia Mother    Hypertension Mother    Cancer Sister     No Known Allergies  Current Outpatient Medications on File Prior to Visit  Medication Sig Dispense Refill   Ascorbic Acid  (VITAMIN C) 100 MG tablet Take 100 mg by mouth daily.     atorvastatin (LIPITOR) 40 MG tablet TAKE 1 TABLET BY MOUTH EVERY DAY FOR CHOLESTEROL 90 tablet 0   cetirizine (ZYRTEC) 10 MG tablet Take 10 mg by mouth daily.     citalopram (CELEXA) 10 MG tablet TAKE 1 TABLET (10 MG TOTAL) BY MOUTH DAILY. FOR ANXIETY 90 tablet 0   diphenhydramine-acetaminophen (TYLENOL PM) 25-500 MG TABS Take 1 tablet by mouth at bedtime as needed.     lisinopril (ZESTRIL) 10 MG tablet TAKE 1 TABLET BY MOUTH EVERY DAY FOR BLOOD PRESSURE 90 tablet 3   Melatonin 5 MG CHEW Chew by mouth at bedtime as needed.     Multiple Vitamin (MULTIVITAMIN) tablet Take 1 tablet by mouth daily.     omeprazole (PRILOSEC) 40 MG capsule TAKE 1 CAPSULE BY MOUTH EVERY DAY BEFORE BREAKFAST 90 capsule 2   VITAMIN D PO Take by mouth daily.     zinc gluconate 50 MG tablet Take 50 mg by mouth daily.     No current facility-administered medications on file prior to visit.    BP 130/78   Pulse 63   Temp 97.6 F (36.4 C) (Temporal)   Ht 5\' 11"  (1.803 m)   Wt 235 lb (106.6 kg)   SpO2 98%   BMI 32.78 kg/m  Objective:   Physical Exam HENT:     Right Ear: Tympanic membrane and ear canal normal.     Left Ear: Tympanic membrane and ear canal normal.     Nose: Nose normal.     Right Sinus: No maxillary sinus tenderness or frontal sinus tenderness.     Left Sinus: No maxillary sinus tenderness or frontal sinus tenderness.  Eyes:     Conjunctiva/sclera: Conjunctivae normal.  Neck:     Thyroid: No thyromegaly.     Vascular: No carotid bruit.  Cardiovascular:     Rate and Rhythm: Normal rate and regular rhythm.     Heart sounds: Normal heart sounds.  Pulmonary:     Effort: Pulmonary effort is normal.     Breath sounds: Normal breath sounds. No wheezing or rales.  Abdominal:     General: Bowel sounds are normal.     Palpations: Abdomen is soft.     Tenderness: There is no abdominal tenderness.  Musculoskeletal:        General: Normal  range of motion.     Cervical back: Neck supple.  Skin:    General: Skin is warm and dry.  Neurological:     Mental Status: He is alert and oriented to person, place, and time.     Cranial Nerves: No cranial nerve deficit.     Deep Tendon Reflexes: Reflexes are normal and symmetric.  Psychiatric:        Mood and Affect: Mood normal.           Assessment & Plan:  Preventative health care Assessment & Plan: Immunizations UTD. Colonoscopy UTD, due  2030  Discussed the importance of a healthy diet and regular exercise in order for weight loss, and to reduce the risk of further co-morbidity.  Exam stable. Labs pending.  Follow up in 1 year for repeat physical.    Essential hypertension Assessment & Plan: Controlled.  Continue lisinopril 10 mg daily. CMP pending.  Orders: -     Comprehensive metabolic panel  GAD (generalized anxiety disorder) Assessment & Plan: Well controlled since switching jobs.  Will wean off citalopram by reducing to 5 mg daily x 2 weeks then stop. He will update.    Hyperlipidemia, unspecified hyperlipidemia type Assessment & Plan: Repeat lipid panel pending.  Discussed the importance of a healthy diet and regular exercise in order for weight loss, and to reduce the risk of further co-morbidity. Continue atorvastatin 40 mg daily.  Orders: -     Lipid panel  Prediabetes Assessment & Plan: Repeat A1C pending.  Discussed the importance of a healthy diet and regular exercise in order for weight loss, and to reduce the risk of further co-morbidity.   Orders: -     Hemoglobin A1c  Erosive esophagitis Assessment & Plan: Controlled.  Continue omeprazole 40 mg daily.         Doreene Nest, NP

## 2022-11-19 NOTE — Assessment & Plan Note (Signed)
Controlled.   Continue lisinopril 10 mg daily.  CMP pending.  

## 2022-11-19 NOTE — Assessment & Plan Note (Signed)
Well controlled since switching jobs.  Will wean off citalopram by reducing to 5 mg daily x 2 weeks then stop. He will update.

## 2022-11-19 NOTE — Assessment & Plan Note (Signed)
Immunizations UTD. Colonoscopy UTD, due 2030  Discussed the importance of a healthy diet and regular exercise in order for weight loss, and to reduce the risk of further co-morbidity.  Exam stable. Labs pending.  Follow up in 1 year for repeat physical.

## 2022-11-19 NOTE — Patient Instructions (Signed)
Stop by the lab prior to leaving today. I will notify you of your results once received.   It was a pleasure to see you today!  

## 2022-11-19 NOTE — Assessment & Plan Note (Signed)
Repeat lipid panel pending.  Discussed the importance of a healthy diet and regular exercise in order for weight loss, and to reduce the risk of further co-morbidity. Continue atorvastatin 40 mg daily.

## 2022-11-19 NOTE — Assessment & Plan Note (Signed)
Controlled.  Continue omeprazole 40 mg daily. 

## 2022-12-01 ENCOUNTER — Other Ambulatory Visit: Payer: Self-pay | Admitting: Primary Care

## 2022-12-01 DIAGNOSIS — I10 Essential (primary) hypertension: Secondary | ICD-10-CM

## 2023-01-12 ENCOUNTER — Other Ambulatory Visit: Payer: Self-pay | Admitting: Primary Care

## 2023-01-12 DIAGNOSIS — E785 Hyperlipidemia, unspecified: Secondary | ICD-10-CM

## 2023-01-13 ENCOUNTER — Ambulatory Visit: Payer: BC Managed Care – PPO | Admitting: Family Medicine

## 2023-01-13 ENCOUNTER — Encounter: Payer: Self-pay | Admitting: Family Medicine

## 2023-01-13 VITALS — BP 102/80 | HR 65 | Temp 98.6°F | Ht 71.0 in | Wt 220.0 lb

## 2023-01-13 DIAGNOSIS — R21 Rash and other nonspecific skin eruption: Secondary | ICD-10-CM | POA: Diagnosis not present

## 2023-01-13 HISTORY — DX: Rash and other nonspecific skin eruption: R21

## 2023-01-13 MED ORDER — TRIAMCINOLONE ACETONIDE 0.5 % EX OINT: 1.0000 | TOPICAL_OINTMENT | Freq: Two times a day (BID) | CUTANEOUS | 0 refills | Status: DC

## 2023-01-13 NOTE — Progress Notes (Signed)
Patient ID: Arthur Stevens, male    DOB: Jun 01, 1975, 47 y.o.   MRN: [id number]  This visit was conducted in person.  BP 102/80 (BP Location: Left Arm, Patient Position: Sitting, Cuff Size: Normal)   Pulse 65   Temp 98.6 F (37 C) (Oral)   Ht 5\' 11"  (1.803 m)   Wt 220 lb (99.8 kg)   SpO2 97%   BMI 30.68 kg/m    CC:  Chief Complaint  Patient presents with   Rash    On left thigh since yesterday. Has applied TAC 0.1% cream and cortisone cream to area.     Subjective:   HPI: Arthur Stevens is a 47 y.o. male patient of Mayra Reel, NP with history of prediabetes, hypertension, generalized anxiety, erosive esophagitis and neurofibroma presenting on 01/13/2023 for Rash (On left thigh since yesterday. Has applied TAC 0.1% cream and cortisone cream to area. )  New onset skin rash on left thigh 36 hours ago.  Has not been outside.  New linear rash on left upper thigh.  Some pain as well as very itchy  No discrete bumps or blisters..  10/4 COVID vaccine and flu.. Triamcinolone and hydrocortisone.  Tried             Relevant past medical, surgical, family and social history reviewed and updated as indicated. Interim medical history since our last visit reviewed. Allergies and medications reviewed and updated. Outpatient Medications Prior to Visit  Medication Sig Dispense Refill   Ascorbic Acid (VITAMIN C) 100 MG tablet Take 100 mg by mouth daily.     atorvastatin (LIPITOR) 40 MG tablet TAKE 1 TABLET BY MOUTH EVERY DAY FOR CHOLESTEROL 90 tablet 2   cetirizine (ZYRTEC) 10 MG tablet Take 10 mg by mouth daily.     citalopram (CELEXA) 10 MG tablet TAKE 1 TABLET (10 MG TOTAL) BY MOUTH DAILY. FOR ANXIETY 90 tablet 0   diphenhydramine-acetaminophen (TYLENOL PM) 25-500 MG TABS Take 1 tablet by mouth at bedtime as needed.     lisinopril (ZESTRIL) 10 MG tablet TAKE 1 TABLET BY MOUTH EVERY DAY FOR BLOOD PRESSURE 90 tablet 3   Melatonin 5 MG CHEW Chew by mouth at bedtime as needed.      Multiple Vitamin (MULTIVITAMIN) tablet Take 1 tablet by mouth daily.     omeprazole (PRILOSEC) 40 MG capsule TAKE 1 CAPSULE BY MOUTH EVERY DAY BEFORE BREAKFAST 90 capsule 2   VITAMIN D PO Take by mouth daily.     zinc gluconate 50 MG tablet Take 50 mg by mouth daily.     No facility-administered medications prior to visit.     Per HPI unless specifically indicated in ROS section below Review of Systems Objective:  BP 102/80 (BP Location: Left Arm, Patient Position: Sitting, Cuff Size: Normal)   Pulse 65   Temp 98.6 F (37 C) (Oral)   Ht 5\' 11"  (1.803 m)   Wt 220 lb (99.8 kg)   SpO2 97%   BMI 30.68 kg/m   Wt Readings from Last 3 Encounters:  01/13/23 220 lb (99.8 kg)  11/19/22 235 lb (106.6 kg)  05/20/22 228 lb (103.4 kg)      Physical Exam Constitutional:      Appearance: He is well-developed.  HENT:     Head: Normocephalic.     Right Ear: Hearing normal.     Left Ear: Hearing normal.  Neck:     Thyroid: No thyroid mass or thyromegaly.     Vascular:  No carotid bruit.     Trachea: Trachea normal.  Cardiovascular:     Rate and Rhythm: Normal rate and regular rhythm.     Pulses: Normal pulses.     Heart sounds: Heart sounds not distant. No murmur heard.    No friction rub. No gallop.     Comments: No peripheral edema Pulmonary:     Effort: Pulmonary effort is normal. No respiratory distress.     Breath sounds: Normal breath sounds.  Skin:    General: Skin is warm and dry.     Findings: Rash present.     Comments: See photo  Psychiatric:        Speech: Speech normal.        Behavior: Behavior normal.        Thought Content: Thought content normal.          Results for orders placed or performed in visit on 11/19/22  Lipid panel  Result Value Ref Range   Cholesterol 199 0 - 200 mg/dL   Triglycerides 086.5 (H) 0.0 - 149.0 mg/dL   HDL 78.46 >96.29 mg/dL   VLDL 52.8 (H) 0.0 - 41.3 mg/dL   Total CHOL/HDL Ratio 4    NonHDL 154.32   Hemoglobin A1c  Result  Value Ref Range   Hgb A1c MFr Bld 5.9 4.6 - 6.5 %  Comprehensive metabolic panel  Result Value Ref Range   Sodium 137 135 - 145 mEq/L   Potassium 4.1 3.5 - 5.1 mEq/L   Chloride 101 96 - 112 mEq/L   CO2 29 19 - 32 mEq/L   Glucose, Bld 100 (H) 70 - 99 mg/dL   BUN 17 6 - 23 mg/dL   Creatinine, Ser 2.44 0.40 - 1.50 mg/dL   Total Bilirubin 0.8 0.2 - 1.2 mg/dL   Alkaline Phosphatase 55 39 - 117 U/L   AST 23 0 - 37 U/L   ALT 37 0 - 53 U/L   Total Protein 7.1 6.0 - 8.3 g/dL   Albumin 4.4 3.5 - 5.2 g/dL   GFR 01.02 >72.53 mL/min   Calcium 9.5 8.4 - 10.5 mg/dL  LDL cholesterol, direct  Result Value Ref Range   Direct LDL 148.0 mg/dL    Assessment and Plan  Rash Assessment & Plan: Acute, most consistent with contact or allergic dermatitis.  Appearance not typical of shingles given no blisters and minimal pain, more itchy.  Linear appearance most consistent with contact dermatitis although he does not recall any specific trigger.  Seems to be improving with low-dose triamcinolone, I will send in prescription for higher dose triamcinolone 0.5% to use twice daily for itching.  Return precautions recommended if pain begins or blisters appear for possible consideration of valacyclovir to cover shingles..  Also recommended calling if flulike symptoms or redness spreading or fever. No current shortness of breath or oral swelling, doubt related to recent vaccines but recommended ER visit if oral swelling/shortness of breath.   Other orders -     Triamcinolone Acetonide; Apply 1 Application topically 2 (two) times daily.  Dispense: 30 g; Refill: 0    No follow-ups on file.   Kerby Nora, MD

## 2023-01-13 NOTE — Patient Instructions (Signed)
Continue  Zyrtec daily.  Apply topical triamcinolone 0.5%  twice daily.

## 2023-01-13 NOTE — Assessment & Plan Note (Signed)
Acute, most consistent with contact or allergic dermatitis.  Appearance not typical of shingles given no blisters and minimal pain, more itchy.  Linear appearance most consistent with contact dermatitis although he does not recall any specific trigger.  Seems to be improving with low-dose triamcinolone, I will send in prescription for higher dose triamcinolone 0.5% to use twice daily for itching.  Return precautions recommended if pain begins or blisters appear for possible consideration of valacyclovir to cover shingles..  Also recommended calling if flulike symptoms or redness spreading or fever. No current shortness of breath or oral swelling, doubt related to recent vaccines but recommended ER visit if oral swelling/shortness of breath.

## 2023-01-23 ENCOUNTER — Other Ambulatory Visit: Payer: Self-pay | Admitting: Primary Care

## 2023-01-23 DIAGNOSIS — F411 Generalized anxiety disorder: Secondary | ICD-10-CM

## 2023-05-17 ENCOUNTER — Other Ambulatory Visit: Payer: Self-pay | Admitting: Family Medicine

## 2023-05-28 ENCOUNTER — Other Ambulatory Visit: Payer: Self-pay | Admitting: Gastroenterology

## 2023-05-28 DIAGNOSIS — K2210 Ulcer of esophagus without bleeding: Secondary | ICD-10-CM

## 2023-06-21 ENCOUNTER — Other Ambulatory Visit: Payer: Self-pay | Admitting: Family Medicine

## 2023-06-21 ENCOUNTER — Other Ambulatory Visit: Payer: Self-pay | Admitting: Gastroenterology

## 2023-06-21 ENCOUNTER — Telehealth: Payer: Self-pay

## 2023-06-21 DIAGNOSIS — K2210 Ulcer of esophagus without bleeding: Secondary | ICD-10-CM

## 2023-06-21 MED ORDER — OMEPRAZOLE 40 MG PO CPDR: 40.0000 mg | DELAYED_RELEASE_CAPSULE | Freq: Every day | ORAL | 0 refills | Status: DC

## 2023-06-21 NOTE — Telephone Encounter (Signed)
 Patient made appointment for May 15 at 1:00pm and he is out of omeprazole. Sent medication to the pharmacy

## 2023-08-12 ENCOUNTER — Other Ambulatory Visit: Payer: Self-pay

## 2023-08-19 ENCOUNTER — Ambulatory Visit (INDEPENDENT_AMBULATORY_CARE_PROVIDER_SITE_OTHER): Admitting: Gastroenterology

## 2023-08-19 VITALS — BP 142/93 | HR 76 | Temp 98.6°F | Wt 232.6 lb

## 2023-08-19 DIAGNOSIS — K219 Gastro-esophageal reflux disease without esophagitis: Secondary | ICD-10-CM

## 2023-08-19 DIAGNOSIS — K2210 Ulcer of esophagus without bleeding: Secondary | ICD-10-CM

## 2023-08-19 NOTE — Progress Notes (Signed)
 Karma Oz, MD [address]  [address]  [place], [place] [address]  Main: [phone]  Fax: [phone]    Gastroenterology Consultation  Referring Provider:     Gabriel John, NP Primary Care Physician:  Gabriel John, NP Primary Gastroenterologist:  Dr. Karma Oz Reason for Consultation: Dysphagia, erosive esophagitis        HPI:   Arthur Stevens is a 48 y.o. male referred by Dr. Fulton Job, Murlean Armour, NP  for consultation & management of dysphagia.  Patient was diagnosed with severe erosive esophagitis based on the upper endoscopy in 06/2020.  He was on 3 months course of omeprazole  40 mg p.o. twice daily which resulted in complete resolution of his symptoms.  Later, switched to H2 blocker as needed.  He was supposed to have a follow-up endoscopy, did not undergo yet pending if the repeat procedure would be covered by his insurance.  He is also due for his colonoscopy due to fair prep.  Patient reports that lately has been experiencing return of difficulty swallowing, food getting stuck associated with regurgitation, especially hard meats.  He does report occasional heartburn only.  He drinks 2 cups of coffee, 2 cans of Diet Coke daily, does drink up to 5-6 beers from Thursday through Sunday regularly.  Follow-up visit 05/04/2022 Patient is here for follow-up of erosive esophagitis.  He underwent EGD in October 2023, found to have persistent erosive esophagitis.  He took omeprazole  40 mg twice daily for 1 month followed by once a day.  Currently taking once a day.  He reports that his symptoms are under control.  He is eating more salads per week, cut back on alcohol, switched to hot tea  Follow-up visit 08/19/2023 Arthur Stevens is here for follow-up of chronic GERD.  He no longer has erosive esophagitis.  He continues to take omeprazole  40 mg once a day before breakfast.  He does not have any symptoms of reflux.  He lost about 15 pounds up until October weight following  healthy diet and exercise and then regained weight during holidays.  Patient does not smoke or vape  NSAIDs: None  Antiplts/Anticoagulants/Anti thrombotics: None  GI Procedures:  Upper endoscopy 05/20/2022 - Normal duodenal bulb and second portion of the duodenum. - Normal stomach. - Small hiatal hernia. - Normal gastroesophageal junction and esophagus. - No specimens collected.   EGD and colonoscopy 01/22/2022 EGD revealed LA grade C esophagitis Small hiatal hernia Normal stomach and duodenum  Excellent bowel prep 2 subcentimeter polyps, removed  DIAGNOSIS:  A. ESOPHAGUS, RANDOM; BIOPSY:  - SQUAMOUS MUCOSA WITH CHANGES CONSISTENT WITH REFLUX.  - NEGATIVE FOR INTRAEPITHELIAL EOSINOPHILS, DYSPLASIA, AND MALIGNANCY.   B.  COLON POLYP, DESCENDING; BIOPSY:  - HYPERPLASTIC POLYP.  - NEGATIVE FOR DYSPLASIA AND MALIGNANCY.   C.  COLON POLYP, SIGMOID; COLD SNARE:  - TUBULAR ADENOMA.  - NEGATIVE FOR HIGH-GRADE DYSPLASIA AND MALIGNANCY.    EGD and colonoscopy 07/03/2020 - LA Grade C reflux esophagitis with no bleeding. Biopsied. - Esophageal ulcer with no bleeding and no stigmata of recent bleeding. - Nodular mucosa in the esophagus. Biopsied. - Erythematous mucosa in the antrum. Biopsied. - 2 cm hiatal hernia. - Erythematous duodenopathy. - Biopsies were obtained in the gastric body, at the incisura and in the gastric antrum.  - Preparation of the colon was fair. - The rectum, sigmoid colon, descending colon, transverse colon, ascending colon and cecum are normal. - The distal rectum and anal verge are normal  on retroflexion view. - No specimens collected.  DIAGNOSIS:  A. STOMACH; COLD BIOPSY:  - CHANGES SUGGESTIVE OF HEALING MUCOSAL INJURY.  - NEGATIVE FOR ACTIVE INFLAMMATION AND H PYLORI.  - NEGATIVE FOR INTESTINAL METAPLASIA, DYSPLASIA, AND MALIGNANCY.   B. ESOPHAGUS, THICKENING; COLD BIOPSY:  - SQUAMOCOLUMNAR JUNCTION WITH REACTIVE FOVEOLAR HYPERPLASIA AND  FEATURES  SUGGESTIVE OF REFLUX.  - NEGATIVE FOR INTESTINAL METAPLASIA, DYSPLASIA, AND MALIGNANCY.   C. ESOPHAGUS, DISTAL; COLD BIOPSY:  - SQUAMOCOLUMNAR JUNCTION WITH NO SIGNIFICANT PATHOLOGIC ALTERATION.  - NEGATIVE FOR INCREASED EOSINOPHILS.  - NEGATIVE FOR INTESTINAL METAPLASIA, DYSPLASIA, AND MALIGNANCY.   D. ESOPHAGUS, PROXIMAL; COLD BIOPSY:  - SQUAMOUS MUCOSA WITH NO SIGNIFICANT PATHOLOGIC ALTERATION.  - NEGATIVE FOR INCREASED EOSINOPHILS.  - NEGATIVE FOR INTESTINAL METAPLASIA, DYSPLASIA, AND MALIGNANCY.   Past Medical History:  Diagnosis Date   Acute pain of right knee 04/05/2018   Allergy    Anxiety    Hyperlipidemia    Hypertension     Past Surgical History:  Procedure Laterality Date   COLONOSCOPY WITH PROPOFOL  N/A 07/03/2020   Procedure: COLONOSCOPY WITH PROPOFOL ;  Surgeon: Irby Mannan, MD;  Location: [facility];  Service: Endoscopy;  Laterality: N/A;   COLONOSCOPY WITH PROPOFOL  N/A 01/22/2022   Procedure: COLONOSCOPY WITH PROPOFOL ;  Surgeon: Selena Daily, MD;  Location: [facility];  Service: Endoscopy;  Laterality: N/A;   ESOPHAGOGASTRODUODENOSCOPY (EGD) WITH PROPOFOL  N/A 07/03/2020   Procedure: ESOPHAGOGASTRODUODENOSCOPY (EGD) WITH PROPOFOL ;  Surgeon: Irby Mannan, MD;  Location: [facility];  Service: Endoscopy;  Laterality: N/A;   ESOPHAGOGASTRODUODENOSCOPY (EGD) WITH PROPOFOL  N/A 01/22/2022   Procedure: ESOPHAGOGASTRODUODENOSCOPY (EGD) WITH PROPOFOL ;  Surgeon: Selena Daily, MD;  Location: [facility];  Service: Endoscopy;  Laterality: N/A;   ESOPHAGOGASTRODUODENOSCOPY (EGD) WITH PROPOFOL  N/A 05/20/2022   Procedure: ESOPHAGOGASTRODUODENOSCOPY (EGD) WITH PROPOFOL ;  Surgeon: Selena Daily, MD;  Location: [facility];  Service: Gastroenterology;  Laterality: N/A;   POLYPECTOMY  01/22/2022   Procedure: POLYPECTOMY;  Surgeon: Selena Daily, MD;  Location: [facility];  Service: Endoscopy;;     Current  Outpatient Medications:    Ascorbic Acid (VITAMIN C) 100 MG tablet, Take 100 mg by mouth daily., Disp: , Rfl:    atorvastatin  (LIPITOR) 40 MG tablet, TAKE 1 TABLET BY MOUTH EVERY DAY FOR CHOLESTEROL, Disp: 90 tablet, Rfl: 2   cetirizine (ZYRTEC) 10 MG tablet, Take 10 mg by mouth daily., Disp: , Rfl:    citalopram  (CELEXA ) 10 MG tablet, TAKE 1 TABLET (10 MG TOTAL) BY MOUTH DAILY. FOR ANXIETY, Disp: 90 tablet, Rfl: 2   diphenhydramine-acetaminophen (TYLENOL PM) 25-500 MG TABS, Take 1 tablet by mouth at bedtime as needed., Disp: , Rfl:    lisinopril  (ZESTRIL ) 10 MG tablet, TAKE 1 TABLET BY MOUTH EVERY DAY FOR BLOOD PRESSURE, Disp: 90 tablet, Rfl: 3   Melatonin 5 MG CHEW, Chew by mouth at bedtime as needed., Disp: , Rfl:    Multiple Vitamin (MULTIVITAMIN) tablet, Take 1 tablet by mouth daily., Disp: , Rfl:    omeprazole  (PRILOSEC) 40 MG capsule, Take 1 capsule (40 mg total) by mouth daily., Disp: 90 capsule, Rfl: 0   VITAMIN D PO, Take by mouth daily., Disp: , Rfl:    zinc gluconate 50 MG tablet, Take 50 mg by mouth daily., Disp: , Rfl:    Family History  Problem Relation Age of Onset   Hyperlipidemia Mother    Hypertension Mother    Cancer Sister      Social History  Tobacco Use   Smoking status: Never   Smokeless tobacco: Never  Vaping Use   Vaping status: Never Used  Substance Use Topics   Alcohol use: Yes    Alcohol/week: 15.0 standard drinks of alcohol    Types: 15 Cans of beer per week    Comment: 5-6 beers on F, Sa &Su   Drug use: No    Allergies as of 08/19/2023   (No Known Allergies)    Review of Systems:    All systems reviewed and negative except where noted in HPI.   Physical Exam:  BP (!) 142/93 (BP Location: Right Arm, Patient Position: Sitting, Cuff Size: Large)   Pulse 76   Temp 98.6 F (37 C) (Oral)   Wt 232 lb 9.6 oz (105.5 kg)   BMI 32.44 kg/m  No LMP for male patient.  General:   Alert,  Well-developed, well-nourished, pleasant and cooperative in  NAD Head:  Normocephalic and atraumatic. Eyes:  Sclera clear, no icterus.   Conjunctiva pink. Ears:  Normal auditory acuity. Nose:  No deformity, discharge, or lesions. Mouth:  No deformity or lesions,oropharynx pink & moist. Neck:  Supple; no masses or thyromegaly. Lungs:  Respirations even and unlabored.  Clear throughout to auscultation.   No wheezes, crackles, or rhonchi. No acute distress. Heart:  Regular rate and rhythm; no murmurs, clicks, rubs, or gallops. Abdomen:  Normal bowel sounds. Soft, non-tender and non-distended without masses, hepatosplenomegaly or hernias noted.  No guarding or rebound tenderness.   Rectal: Not performed Msk:  Symmetrical without gross deformities. Good, equal movement & strength bilaterally. Pulses:  Normal pulses noted. Extremities:  No clubbing or edema.  No cyanosis. Neurologic:  Alert and oriented x3;  grossly normal neurologically. Skin:  Intact without significant lesions or rashes. No jaundice. Psych:  Alert and cooperative. Normal mood and affect.  Imaging Studies: Reviewed  Assessment and Plan:   RAHEEN CAPILI is a 49 y.o. pleasant Caucasian male with history of hypertension, hyperlipidemia, LA grade C esophagitis is seen in for follow-up of erosive esophagitis, dysphagia  Erosive esophagitis, recurrence of dysphagia: Symptoms have currently resolved Repeat EGD revealed LA grade C esophagitis, no evidence of eosinophilic esophagitis Repeat EGD in 05/2022 confirm healing of erosive esophagitis Advised patient that he could decrease omeprazole  to 20 mg once a day before breakfast Continue antireflux lifestyle Strongly reiterated to follow healthy lifestyle   Follow up as needed   Karma Oz, MD

## 2023-08-21 ENCOUNTER — Encounter: Payer: Self-pay | Admitting: Gastroenterology

## 2023-09-16 ENCOUNTER — Other Ambulatory Visit: Payer: Self-pay | Admitting: Gastroenterology

## 2023-09-16 DIAGNOSIS — K2210 Ulcer of esophagus without bleeding: Secondary | ICD-10-CM

## 2023-10-12 ENCOUNTER — Other Ambulatory Visit: Payer: Self-pay | Admitting: Primary Care

## 2023-10-12 DIAGNOSIS — E785 Hyperlipidemia, unspecified: Secondary | ICD-10-CM

## 2023-10-12 NOTE — Telephone Encounter (Signed)
Patient is due for CPE/follow up in late August, this will be required prior to any further refills.  Please schedule, thank you!   

## 2023-10-12 NOTE — Telephone Encounter (Signed)
 Lvm to schedule CPE in late August

## 2023-10-20 ENCOUNTER — Other Ambulatory Visit: Payer: Self-pay | Admitting: Primary Care

## 2023-10-20 DIAGNOSIS — F411 Generalized anxiety disorder: Secondary | ICD-10-CM

## 2023-11-22 ENCOUNTER — Other Ambulatory Visit: Payer: Self-pay | Admitting: Primary Care

## 2023-11-22 DIAGNOSIS — I10 Essential (primary) hypertension: Secondary | ICD-10-CM

## 2023-11-23 ENCOUNTER — Ambulatory Visit (INDEPENDENT_AMBULATORY_CARE_PROVIDER_SITE_OTHER): Admitting: Primary Care

## 2023-11-23 ENCOUNTER — Encounter: Payer: Self-pay | Admitting: Primary Care

## 2023-11-23 VITALS — BP 138/84 | HR 77 | Temp 97.5°F | Ht 71.0 in | Wt 235.0 lb

## 2023-11-23 DIAGNOSIS — E785 Hyperlipidemia, unspecified: Secondary | ICD-10-CM | POA: Diagnosis not present

## 2023-11-23 DIAGNOSIS — F411 Generalized anxiety disorder: Secondary | ICD-10-CM

## 2023-11-23 DIAGNOSIS — R7303 Prediabetes: Secondary | ICD-10-CM | POA: Diagnosis not present

## 2023-11-23 DIAGNOSIS — J3089 Other allergic rhinitis: Secondary | ICD-10-CM | POA: Insufficient documentation

## 2023-11-23 DIAGNOSIS — Z23 Encounter for immunization: Secondary | ICD-10-CM

## 2023-11-23 DIAGNOSIS — K2210 Ulcer of esophagus without bleeding: Secondary | ICD-10-CM | POA: Diagnosis not present

## 2023-11-23 DIAGNOSIS — I10 Essential (primary) hypertension: Secondary | ICD-10-CM

## 2023-11-23 DIAGNOSIS — Z0000 Encounter for general adult medical examination without abnormal findings: Secondary | ICD-10-CM | POA: Diagnosis not present

## 2023-11-23 LAB — COMPREHENSIVE METABOLIC PANEL WITH GFR
ALT: 49 U/L (ref 0–53)
AST: 28 U/L (ref 0–37)
Albumin: 4.4 g/dL (ref 3.5–5.2)
Alkaline Phosphatase: 62 U/L (ref 39–117)
BUN: 14 mg/dL (ref 6–23)
CO2: 28 meq/L (ref 19–32)
Calcium: 9.5 mg/dL (ref 8.4–10.5)
Chloride: 100 meq/L (ref 96–112)
Creatinine, Ser: 0.88 mg/dL (ref 0.40–1.50)
GFR: 100.88 mL/min (ref >60.00)
Glucose, Bld: 108 mg/dL — ABNORMAL HIGH (ref 70–99)
Potassium: 4.3 meq/L (ref 3.5–5.1)
Sodium: 137 meq/L (ref 135–145)
Total Bilirubin: 0.8 mg/dL (ref 0.2–1.2)
Total Protein: 6.9 g/dL (ref 6.0–8.3)

## 2023-11-23 LAB — CBC
HCT: 42.2 % (ref 39.0–52.0)
Hemoglobin: 14.3 g/dL (ref 13.0–17.0)
MCHC: 33.8 g/dL (ref 30.0–36.0)
MCV: 88 fl (ref 78.0–100.0)
Platelets: 247 K/uL (ref 150.0–400.0)
RBC: 4.8 Mil/uL (ref 4.22–5.81)
RDW: 13 % (ref 11.5–15.5)
WBC: 5 K/uL (ref 4.0–10.5)

## 2023-11-23 LAB — LIPID PANEL
Cholesterol: 204 mg/dL — ABNORMAL HIGH (ref 0–200)
HDL: 53 mg/dL (ref >39.00)
LDL Cholesterol: 108 mg/dL — ABNORMAL HIGH (ref 0–99)
NonHDL: 150.64
Total CHOL/HDL Ratio: 4
Triglycerides: 211 mg/dL — ABNORMAL HIGH (ref 0.0–149.0)
VLDL: 42.2 mg/dL — ABNORMAL HIGH (ref 0.0–40.0)

## 2023-11-23 LAB — HEMOGLOBIN A1C: Hgb A1c MFr Bld: 6.3 % (ref 4.6–6.5)

## 2023-11-23 NOTE — Patient Instructions (Signed)
 Stop by the lab prior to leaving today. I will notify you of your results once received.   Stop taking Zyrtec for allergies.  Try Levocetirizine (Xyzal) at bedtime for allergies.  It was a pleasure to see you today!

## 2023-11-23 NOTE — Assessment & Plan Note (Signed)
 Following with GI.  Continue omeprazole  20 mg daily.

## 2023-11-23 NOTE — Assessment & Plan Note (Signed)
Repeat A1C pending.  Discussed the importance of a healthy diet and regular exercise in order for weight loss, and to reduce the risk of further co-morbidity.  

## 2023-11-23 NOTE — Assessment & Plan Note (Signed)
 Stable.  Continue lisinopril  10 mg daily. CMP pending.

## 2023-11-23 NOTE — Assessment & Plan Note (Signed)
 Repeat lipid panel pending. Continue atorvastatin 40 mg daily.

## 2023-11-23 NOTE — Assessment & Plan Note (Signed)
 Tetanus vaccine provided today Colonoscopy UTD, due in 2030  Discussed the importance of a healthy diet and regular exercise in order for weight loss, and to reduce the risk of further co-morbidity.  Exam stable. Labs pending.  Follow up in 1 year for repeat physical.

## 2023-11-23 NOTE — Progress Notes (Signed)
 Subjective:    Patient ID: Arthur Stevens, male    DOB: 12/20/1975, 48 y.o.   MRN: [id number]   History of Present Illness    Arthur Stevens is a very pleasant 48 y.o. male who presents today for complete physical and follow up of chronic conditions.  Immunizations: -Tetanus: Completed in 2015  Diet: Fair diet.  Exercise: No regular exercise.  Eye exam: Completes annually  Dental exam: Completes semi-annually    Colonoscopy: Completed in 2023, due 2030  BP Readings from Last 3 Encounters:  11/23/23 138/84  08/19/23 (!) 142/93  01/13/23 102/80       Review of Systems  Constitutional:  Negative for unexpected weight change.  HENT:  Negative for rhinorrhea.   Respiratory:  Negative for cough and shortness of breath.   Cardiovascular:  Negative for chest pain.  Gastrointestinal:  Negative for constipation and diarrhea.  Genitourinary:  Negative for difficulty urinating.  Musculoskeletal:  Negative for arthralgias and myalgias.  Skin:  Negative for rash.  Allergic/Immunologic: Negative for environmental allergies.  Neurological:  Negative for dizziness and headaches.  Psychiatric/Behavioral:  The patient is not nervous/anxious.          Past Medical History:  Diagnosis Date   Acute pain of right knee 04/05/2018   Allergy    Anxiety    Hyperlipidemia    Hypertension    Rash 01/13/2023    Social History   Socioeconomic History   Marital status: Married    Spouse name: Not on file   Number of children: Not on file   Years of education: Not on file   Highest education level: Bachelor's degree (e.g., BA, AB, BS)  Occupational History   Not on file  Tobacco Use   Smoking status: Never   Smokeless tobacco: Never  Vaping Use   Vaping status: Never Used  Substance and Sexual Activity   Alcohol use: Yes    Alcohol/week: 15.0 standard drinks of alcohol    Types: 15 Cans of beer per week    Comment: 5-6 beers on F, Sa &Su   Drug use: No   Sexual  activity: Yes    Birth control/protection: None  Other Topics Concern   Not on file  Social History Narrative   Married.   2 children.   Works as a Geographical information systems officer.   Enjoys spending time with children, playing golf   Social Drivers of Health   Financial Resource Strain: Low Risk  (11/22/2023)   Overall Financial Resource Strain (CARDIA)    Difficulty of Paying Living Expenses: Not hard at all  Food Insecurity: No Food Insecurity (11/22/2023)   Hunger Vital Sign    Worried About Running Out of Food in the Last Year: Never true    Ran Out of Food in the Last Year: Never true  Transportation Needs: No Transportation Needs (11/22/2023)   PRAPARE - Administrator, Civil Service (Medical): No    Lack of Transportation (Non-Medical): No  Physical Activity: Insufficiently Active (11/22/2023)   Exercise Vital Sign    Days of Exercise per Week: 1 day    Minutes of Exercise per Session: 20 min  Stress: No Stress Concern Present (11/22/2023)   [employer] of Occupational Health - Occupational Stress Questionnaire    Feeling of Stress: Not at all  Social Connections: Moderately Isolated (11/22/2023)   Social Connection and Isolation Panel    Frequency of Communication with Friends and Family: More than three times a week  Frequency of Social Gatherings with Friends and Family: More than three times a week    Attends Religious Services: Never    Database administrator or Organizations: No    Attends Engineer, structural: Not on file    Marital Status: Married  Catering manager Violence: Not on file    Past Surgical History:  Procedure Laterality Date   COLONOSCOPY WITH PROPOFOL  N/A 07/03/2020   Procedure: COLONOSCOPY WITH PROPOFOL ;  Surgeon: Janalyn Keene NOVAK, MD;  Location: [facility];  Service: Endoscopy;  Laterality: N/A;   COLONOSCOPY WITH PROPOFOL  N/A 01/22/2022   Procedure: COLONOSCOPY WITH PROPOFOL ;  Surgeon: Unk Corinn Skiff, MD;  Location:  [facility];  Service: Endoscopy;  Laterality: N/A;   ESOPHAGOGASTRODUODENOSCOPY (EGD) WITH PROPOFOL  N/A 07/03/2020   Procedure: ESOPHAGOGASTRODUODENOSCOPY (EGD) WITH PROPOFOL ;  Surgeon: Janalyn Keene NOVAK, MD;  Location: [facility];  Service: Endoscopy;  Laterality: N/A;   ESOPHAGOGASTRODUODENOSCOPY (EGD) WITH PROPOFOL  N/A 01/22/2022   Procedure: ESOPHAGOGASTRODUODENOSCOPY (EGD) WITH PROPOFOL ;  Surgeon: Unk Corinn Skiff, MD;  Location: [facility];  Service: Endoscopy;  Laterality: N/A;   ESOPHAGOGASTRODUODENOSCOPY (EGD) WITH PROPOFOL  N/A 05/20/2022   Procedure: ESOPHAGOGASTRODUODENOSCOPY (EGD) WITH PROPOFOL ;  Surgeon: Unk Corinn Skiff, MD;  Location: [facility] END[facility];  Service: Gastroenterology;  Laterality: N/A;   POLYPECTOMY  01/22/2022   Procedure: POLYPECTOMY;  Surgeon: Unk Corinn Skiff, MD;  Location: [facility];  Service: Endoscopy;;    Family History  Problem Relation Age of Onset   Hyperlipidemia Mother    Hypertension Mother    Cancer Sister     No Known Allergies  Current Outpatient Medications on File Prior to Visit  Medication Sig Dispense Refill   Ascorbic Acid (VITAMIN C) 100 MG tablet Take 100 mg by mouth daily.     atorvastatin  (LIPITOR) 40 MG tablet TAKE 1 TABLET BY MOUTH EVERY DAY FOR CHOLESTEROL 90 tablet 0   cetirizine (ZYRTEC) 10 MG tablet Take 10 mg by mouth daily.     citalopram  (CELEXA ) 10 MG tablet TAKE 1 TABLET (10 MG TOTAL) BY MOUTH DAILY. FOR ANXIETY 90 tablet 0   diphenhydramine-acetaminophen (TYLENOL PM) 25-500 MG TABS Take 1 tablet by mouth at bedtime as needed.     lisinopril  (ZESTRIL ) 10 MG tablet TAKE 1 TABLET BY MOUTH EVERY DAY FOR BLOOD PRESSURE 90 tablet 3   Melatonin 5 MG CHEW Chew by mouth at bedtime as needed.     Multiple Vitamin (MULTIVITAMIN) tablet Take 1 tablet by mouth daily.     VITAMIN D PO Take by mouth daily.     zinc gluconate 50 MG tablet Take 50 mg by mouth daily.     No current  facility-administered medications on file prior to visit.    BP 138/84   Pulse 77   Temp (!) 97.5 F (36.4 C) (Temporal)   Ht 5' 11 (1.803 m)   Wt 235 lb (106.6 kg)   SpO2 97%   BMI 32.78 kg/m  Objective:   Physical Exam HENT:     Right Ear: Tympanic membrane and ear canal normal.     Left Ear: Tympanic membrane and ear canal normal.  Eyes:     Pupils: Pupils are equal, round, and reactive to light.  Cardiovascular:     Rate and Rhythm: Normal rate and regular rhythm.  Pulmonary:     Effort: Pulmonary effort is normal.     Breath sounds: Normal breath sounds.  Abdominal:     General: Bowel sounds are normal.  Palpations: Abdomen is soft.     Tenderness: There is no abdominal tenderness.  Musculoskeletal:        General: Normal range of motion.     Cervical back: Neck supple.  Skin:    General: Skin is warm and dry.  Neurological:     Mental Status: He is alert and oriented to person, place, and time.     Cranial Nerves: No cranial nerve deficit.     Deep Tendon Reflexes:     Reflex Scores:      Patellar reflexes are 2+ on the right side and 2+ on the left side. Psychiatric:        Mood and Affect: Mood normal.     Physical Exam        Assessment & Plan:  Preventative health care Assessment & Plan: Tetanus vaccine provided today Colonoscopy UTD, due in 2030  Discussed the importance of a healthy diet and regular exercise in order for weight loss, and to reduce the risk of further co-morbidity.  Exam stable. Labs pending.  Follow up in 1 year for repeat physical.    Essential hypertension Assessment & Plan: Stable.  Continue lisinopril  10 mg daily. CMP pending.  Orders: -     Comprehensive metabolic panel with GFR -     CBC  Erosive esophagitis Assessment & Plan: Following with GI.  Continue omeprazole  20 mg daily.   Prediabetes Assessment & Plan: Repeat A1C pending.  Discussed the importance of a healthy diet and regular  exercise in order for weight loss, and to reduce the risk of further co-morbidity.   Orders: -     Hemoglobin A1c  Hyperlipidemia, unspecified hyperlipidemia type Assessment & Plan: Repeat lipid panel pending.  Continue atorvastatin  40 mg daily.  Orders: -     Lipid panel  GAD (generalized anxiety disorder) Assessment & Plan: Controlled.  Continue citalopram  10 mg daily   Environmental and seasonal allergies Assessment & Plan: Uncontrolled.  Stop Zyrtec.  Start Xyzal 5 mg HS.     Assessment and Plan Assessment & Plan         Comer MARLA Gaskins, NP

## 2023-11-23 NOTE — Assessment & Plan Note (Signed)
 Uncontrolled.  Stop Zyrtec.  Start Xyzal 5 mg HS.

## 2023-11-23 NOTE — Assessment & Plan Note (Signed)
Controlled.  Continue citalopram 10 mg daily.  

## 2023-11-24 ENCOUNTER — Ambulatory Visit: Payer: Self-pay | Admitting: Primary Care

## 2023-11-24 DIAGNOSIS — R7303 Prediabetes: Secondary | ICD-10-CM

## 2024-01-06 ENCOUNTER — Other Ambulatory Visit: Payer: Self-pay | Admitting: Primary Care

## 2024-01-06 DIAGNOSIS — E785 Hyperlipidemia, unspecified: Secondary | ICD-10-CM

## 2024-01-21 ENCOUNTER — Other Ambulatory Visit: Payer: Self-pay | Admitting: Primary Care

## 2024-01-21 DIAGNOSIS — F411 Generalized anxiety disorder: Secondary | ICD-10-CM

## 2024-04-27 DIAGNOSIS — I10 Essential (primary) hypertension: Secondary | ICD-10-CM

## 2024-04-27 MED ORDER — LISINOPRIL 10 MG PO TABS: 10.0000 mg | ORAL_TABLET | Freq: Every day | ORAL | 0 refills | Status: AC
